# Patient Record
Sex: Male | Born: 1935
Health system: Southern US, Community
[De-identification: ages and names within clinical notes are randomized; demographics above are authoritative.]

## PROBLEM LIST (undated history)

## (undated) DIAGNOSIS — I639 Cerebral infarction, unspecified: Secondary | ICD-10-CM

## (undated) DIAGNOSIS — H409 Unspecified glaucoma: Secondary | ICD-10-CM

## (undated) DIAGNOSIS — E876 Hypokalemia: Secondary | ICD-10-CM

## (undated) DIAGNOSIS — H269 Unspecified cataract: Secondary | ICD-10-CM

## (undated) DIAGNOSIS — K5900 Constipation, unspecified: Secondary | ICD-10-CM

## (undated) DIAGNOSIS — I509 Heart failure, unspecified: Secondary | ICD-10-CM

## (undated) DIAGNOSIS — I10 Essential (primary) hypertension: Secondary | ICD-10-CM

## (undated) DIAGNOSIS — S37009A Unspecified injury of unspecified kidney, initial encounter: Secondary | ICD-10-CM

## (undated) HISTORY — DX: Unspecified cataract: H26.9

## (undated) HISTORY — DX: Cerebral infarction, unspecified: I63.9

## (undated) HISTORY — DX: Constipation, unspecified: K59.00

## (undated) HISTORY — DX: Hypokalemia: E87.6

## (undated) HISTORY — DX: Heart failure, unspecified: I50.9

## (undated) HISTORY — DX: Unspecified injury of unspecified kidney, initial encounter: S37.009A

## (undated) HISTORY — DX: Unspecified glaucoma: H40.9

---

## 2015-01-10 ENCOUNTER — Emergency Department (HOSPITAL_BASED_OUTPATIENT_CLINIC_OR_DEPARTMENT_OTHER)
Admission: EM | Admit: 2015-01-10 | Discharge: 2015-01-10 | Disposition: A | Discharge status: Home / Self Care | Payer: Medicare PPO | Attending: Emergency Medicine | Admitting: Emergency Medicine

## 2015-01-10 ENCOUNTER — Emergency Department (HOSPITAL_BASED_OUTPATIENT_CLINIC_OR_DEPARTMENT_OTHER): Payer: Medicare PPO

## 2015-01-10 ENCOUNTER — Encounter (HOSPITAL_BASED_OUTPATIENT_CLINIC_OR_DEPARTMENT_OTHER): Payer: Self-pay | Admitting: *Deleted

## 2015-01-10 DIAGNOSIS — Y9301 Activity, walking, marching and hiking: Secondary | ICD-10-CM | POA: Diagnosis not present

## 2015-01-10 DIAGNOSIS — W01198A Fall on same level from slipping, tripping and stumbling with subsequent striking against other object, initial encounter: Secondary | ICD-10-CM | POA: Diagnosis not present

## 2015-01-10 DIAGNOSIS — Y998 Other external cause status: Secondary | ICD-10-CM | POA: Diagnosis not present

## 2015-01-10 DIAGNOSIS — S199XXA Unspecified injury of neck, initial encounter: Secondary | ICD-10-CM | POA: Insufficient documentation

## 2015-01-10 DIAGNOSIS — I10 Essential (primary) hypertension: Secondary | ICD-10-CM | POA: Diagnosis not present

## 2015-01-10 DIAGNOSIS — W19XXXA Unspecified fall, initial encounter: Secondary | ICD-10-CM

## 2015-01-10 DIAGNOSIS — Y92002 Bathroom of unspecified non-institutional (private) residence single-family (private) house as the place of occurrence of the external cause: Secondary | ICD-10-CM | POA: Diagnosis not present

## 2015-01-10 DIAGNOSIS — N3000 Acute cystitis without hematuria: Secondary | ICD-10-CM

## 2015-01-10 DIAGNOSIS — S0990XA Unspecified injury of head, initial encounter: Secondary | ICD-10-CM | POA: Insufficient documentation

## 2015-01-10 DIAGNOSIS — M542 Cervicalgia: Secondary | ICD-10-CM

## 2015-01-10 HISTORY — DX: Essential (primary) hypertension: I10

## 2015-01-10 LAB — URINALYSIS, ROUTINE W REFLEX MICROSCOPIC
BILIRUBIN URINE: NEGATIVE
Glucose, UA: NEGATIVE mg/dL
Hgb urine dipstick: NEGATIVE
KETONES UR: NEGATIVE mg/dL
NITRITE: POSITIVE — AB
PROTEIN: NEGATIVE mg/dL
SPECIFIC GRAVITY, URINE: 1.025 (ref 1.005–1.030)
UROBILINOGEN UA: 0.2 mg/dL (ref 0.0–1.0)
pH: 6 (ref 5.0–8.0)

## 2015-01-10 LAB — URINE MICROSCOPIC-ADD ON

## 2015-01-10 LAB — CBC WITH DIFFERENTIAL/PLATELET
BASOS ABS: 0 10*3/uL (ref 0.0–0.1)
BASOS PCT: 0 %
Eosinophils Absolute: 0.1 10*3/uL (ref 0.0–0.7)
Eosinophils Relative: 4 %
HCT: 35.6 % — ABNORMAL LOW (ref 39.0–52.0)
HEMOGLOBIN: 11.5 g/dL — AB (ref 13.0–17.0)
LYMPHS ABS: 1.1 10*3/uL (ref 0.7–4.0)
Lymphocytes Relative: 28 %
MCH: 23.4 pg — ABNORMAL LOW (ref 26.0–34.0)
MCHC: 32.3 g/dL (ref 30.0–36.0)
MCV: 72.5 fL — ABNORMAL LOW (ref 78.0–100.0)
Monocytes Absolute: 0.6 10*3/uL (ref 0.1–1.0)
Monocytes Relative: 15 %
NEUTROS PCT: 54 %
Neutro Abs: 2.2 10*3/uL (ref 1.7–7.7)
Platelets: 190 10*3/uL (ref 150–400)
RBC: 4.91 MIL/uL (ref 4.22–5.81)
RDW: 16.1 % — ABNORMAL HIGH (ref 11.5–15.5)
WBC: 4 10*3/uL (ref 4.0–10.5)

## 2015-01-10 LAB — BASIC METABOLIC PANEL
ANION GAP: 5 (ref 5–15)
BUN: 15 mg/dL (ref 6–20)
CO2: 26 mmol/L (ref 22–32)
Calcium: 9.5 mg/dL (ref 8.9–10.3)
Chloride: 109 mmol/L (ref 101–111)
Creatinine, Ser: 0.94 mg/dL (ref 0.61–1.24)
Glucose, Bld: 105 mg/dL — ABNORMAL HIGH (ref 65–99)
POTASSIUM: 4 mmol/L (ref 3.5–5.1)
SODIUM: 140 mmol/L (ref 135–145)

## 2015-01-10 MED ORDER — ACETAMINOPHEN 325 MG PO TABS
650.0000 mg | ORAL_TABLET | Freq: Once | ORAL | Status: AC
  Administered 2015-01-10: 650 mg via ORAL
  Filled 2015-01-10: qty 2

## 2015-01-10 MED ORDER — ACETAMINOPHEN 325 MG PO TABS: 650.0000 mg | ORAL_TABLET | Freq: Once | ORAL | Status: DC

## 2015-01-10 MED ORDER — CEPHALEXIN 250 MG PO CAPS
500.0000 mg | ORAL_CAPSULE | Freq: Once | ORAL | Status: AC
  Administered 2015-01-10: 500 mg via ORAL
  Filled 2015-01-10: qty 2

## 2015-01-10 MED ORDER — CEPHALEXIN 500 MG PO CAPS: 500.0000 mg | ORAL_CAPSULE | Freq: Two times a day (BID) | ORAL | Status: DC

## 2015-01-10 NOTE — ED Notes (Signed)
HIGH FALL RISK INTERVENTIONS INITIATED

## 2015-01-10 NOTE — ED Notes (Addendum)
C/o post neck pain (C collar on when arrived to room)

## 2015-01-10 NOTE — ED Provider Notes (Signed)
CSN: [id number]     Arrival date & time 01/10/15  1414 History   First MD Initiated Contact with Patient 01/10/15 1449     Chief Complaint  Patient presents with  . Fall     (Consider location/radiation/quality/duration/timing/severity/associated sxs/prior Treatment) HPI Comments: Patient lives at home presents with complaint of fall. Patient states that he was walking in his bathroom at approximately 11 PM last night. He suddenly felt weak. He did not feel dizzy or lose consciousness. He fell backwards and struck his head on a wall and fell to the ground. He was able to get himself up in bed. He did not call for help. Patient complaining of neck pain and took ibuprofen last night. No vision change, nausea, vomiting, weakness/numbness/tingling in arms or legs. Patient is at his normal baseline mentation per family. No other medical complaints.  Patient also reports feeling generally weak. He is unable to tell me how long this has been ongoing. He denies any specific symptoms of illness including URI symptoms, cough, chest pain, shortness of breath, abdominal pain, vomiting, diarrhea, dysuria, or skin rash.  Patient is a 81 y.o. male presenting with fall. The history is provided by the patient and a relative.  Fall Associated symptoms include neck pain and weakness (gneralized). Pertinent negatives include no chest pain, fatigue, headaches, nausea, numbness or vomiting.    Past Medical History  Diagnosis Date  . Hypertension    History reviewed. No pertinent past surgical history. No family history on file. Social History  Substance Use Topics  . Smoking status: Never Smoker   . Smokeless tobacco: Never Used  . Alcohol Use: No    Review of Systems  Constitutional: Negative for fatigue.  HENT: Negative for tinnitus.   Eyes: Negative for photophobia, pain and visual disturbance.  Respiratory: Negative for shortness of breath.   Cardiovascular: Negative for chest pain.   Gastrointestinal: Negative for nausea and vomiting.  Musculoskeletal: Positive for neck pain. Negative for back pain and gait problem.  Skin: Negative for wound.  Neurological: Positive for weakness (gneralized). Negative for dizziness, light-headedness, numbness and headaches.  Psychiatric/Behavioral: Negative for confusion and decreased concentration.    Allergies  Review of patient's allergies indicates no known allergies.  Home Medications   Prior to Admission medications   Not on File   BP 179/85 mmHg  Pulse 73  Temp(Src) 98.4 F (36.9 C) (Oral)  Resp 18  Ht  (1.803 m)  Wt 200 lb (90.719 kg)  BMI 27.91 kg/m2  SpO2 96%   Physical Exam  Constitutional: He is oriented to person, place, and time. He appears well-developed and well-nourished.  HENT:  Head: Normocephalic and atraumatic. Head is without raccoon's eyes and without Battle's sign.  Right Ear: Tympanic membrane, external ear and ear canal normal. No hemotympanum.  Left Ear: Tympanic membrane, external ear and ear canal normal. No hemotympanum.  Nose: Nose normal. No nasal septal hematoma.  Mouth/Throat: Oropharynx is clear and moist.  Eyes: Conjunctivae, EOM and lids are normal. Pupils are equal, round, and reactive to light.  No visible hyphema  Neck: Normal range of motion. Neck supple.  Cardiovascular: Normal rate and regular rhythm.   Pulmonary/Chest: Effort normal and breath sounds normal. No respiratory distress. He has no wheezes. He has no rales.  Abdominal: Soft. There is no tenderness.  Musculoskeletal: Normal range of motion.       Cervical back: He exhibits tenderness and bony tenderness. He exhibits normal range of motion.  Thoracic back: He exhibits no tenderness and no bony tenderness.       Lumbar back: He exhibits no tenderness and no bony tenderness.  Neurological: He is alert and oriented to person, place, and time. He has normal strength and normal reflexes. No cranial nerve  deficit or sensory deficit. Coordination normal. GCS eye subscore is 4. GCS verbal subscore is 5. GCS motor subscore is 6.  Skin: Skin is warm and dry.  Psychiatric: He has a normal mood and affect.  Nursing note and vitals reviewed.   ED Course  Procedures (including critical care time) Labs Review Labs Reviewed  URINALYSIS, ROUTINE W REFLEX MICROSCOPIC (NOT AT [facility]) - Abnormal; Notable for the following:    APPearance CLOUDY (*)    Nitrite POSITIVE (*)    Leukocytes, UA TRACE (*)    All other components within normal limits  CBC WITH DIFFERENTIAL/PLATELET - Abnormal; Notable for the following:    Hemoglobin 11.5 (*)    HCT 35.6 (*)    MCV 72.5 (*)    MCH 23.4 (*)    RDW 16.1 (*)    All other components within normal limits  BASIC METABOLIC PANEL - Abnormal; Notable for the following:    Glucose, Bld 105 (*)    All other components within normal limits  URINE MICROSCOPIC-ADD ON - Abnormal; Notable for the following:    Bacteria, UA MANY (*)    Casts HYALINE CASTS (*)    All other components within normal limits  URINE CULTURE    Imaging Review Ct Head Wo Contrast  01/10/2015   CLINICAL DATA:  Patient status post fall. Posterior neck pain. No reported loss of consciousness.  EXAM: CT HEAD WITHOUT CONTRAST  CT CERVICAL SPINE WITHOUT CONTRAST  TECHNIQUE: Multidetector CT imaging of the head and cervical spine was performed following the standard protocol without intravenous contrast. Multiplanar CT image reconstructions of the cervical spine were also generated.  COMPARISON:  None.  FINDINGS: CT HEAD FINDINGS  Ventricles and sulci are prominent compatible with atrophy. Extensive periventricular and subcortical white matter hypodensity compatible with chronic small vessel ischemic changes. Encephalomalacia within the left occipital lobe. Paranasal sinuses are unremarkable. Mastoid air cells are unremarkable. The calvarium is intact. No evidence for intracranial hemorrhage or mass  effect.  CT CERVICAL SPINE FINDINGS  There is focal kyphosis of the cervical spine at the C5 level with fusion of the C4-5 and C5-6 levels. Multilevel facet degenerative changes. Craniocervical junction is intact. No evidence for acute cervical spine fracture. Degenerative changes the posterior spinous processes involving the T1 level.  IMPRESSION: Left occipital lobe encephalomalacia, favored represent subacute to chronic infarct.  No acute intracranial hemorrhage.  No acute cervical spine fracture.  Multilevel degenerative changes and associated fusion of the cervical spine.   Electronically Signed   By: Annia Belt M.D.   On: 01/10/2015 15:44   Ct Cervical Spine Wo Contrast  01/10/2015   CLINICAL DATA:  Patient status post fall. Posterior neck pain. No reported loss of consciousness.  EXAM: CT HEAD WITHOUT CONTRAST  CT CERVICAL SPINE WITHOUT CONTRAST  TECHNIQUE: Multidetector CT imaging of the head and cervical spine was performed following the standard protocol without intravenous contrast. Multiplanar CT image reconstructions of the cervical spine were also generated.  COMPARISON:  None.  FINDINGS: CT HEAD FINDINGS  Ventricles and sulci are prominent compatible with atrophy. Extensive periventricular and subcortical white matter hypodensity compatible with chronic small vessel ischemic changes. Encephalomalacia within the left occipital lobe. Paranasal  sinuses are unremarkable. Mastoid air cells are unremarkable. The calvarium is intact. No evidence for intracranial hemorrhage or mass effect.  CT CERVICAL SPINE FINDINGS  There is focal kyphosis of the cervical spine at the C5 level with fusion of the C4-5 and C5-6 levels. Multilevel facet degenerative changes. Craniocervical junction is intact. No evidence for acute cervical spine fracture. Degenerative changes the posterior spinous processes involving the T1 level.  IMPRESSION: Left occipital lobe encephalomalacia, favored represent subacute to chronic  infarct.  No acute intracranial hemorrhage.  No acute cervical spine fracture.  Multilevel degenerative changes and associated fusion of the cervical spine.   Electronically Signed   By: Annia Belt M.D.   On: 01/10/2015 15:44   I have personally reviewed and evaluated these images and lab results as part of my medical decision-making.   EKG Interpretation None       2:50 PM Patient seen and examined. Work-up initiated. Medications ordered. Will check labs given vague c/o generalized weakness.   Vital signs reviewed and are as follows: BP 179/85 mmHg  Pulse 73  Temp(Src) 98.4 F (36.9 C) (Oral)  Resp 18  Ht 5\' 11"  (1.803 m)  Wt 200 lb (90.719 kg)  BMI 27.91 kg/m2  SpO2 96%  5:26 PM Patient discussed with Dr. Rubin Payor.   UA with nitrite positive, many bacteria. Culture sent but will treat. C-collar removed by myself. Patient has some left-sided paraspinal tenderness. Discussed imaging results and blood test results with family.  Encouraged PCP follow-up in the next 2-3 days for recheck. Patient to take his blood pressure medications when arriving home.  MDM   Final diagnoses:  Neck pain  Fall, initial encounter  Acute cystitis without hematuria  Essential hypertension   Fall/neck pain: Head CT and cervical spine imaging negative. No neurological deficits. Previously noted stroke.  Weakness: Workup suggestive of UTI only. Otherwise, blood counts and electrolytes noncontributory. Will treat with Keflex. Patient appears well. No indication for admission at this time. Family to monitor patient.    Renne Crigler, PA-C 01/10/15 1729  Benjiman Core, MD 01/11/15 [id number]

## 2015-01-10 NOTE — ED Notes (Signed)
c-collar applied in triage

## 2015-01-10 NOTE — ED Notes (Addendum)
Pt states fell last PM in bathroom, no LOC, felt weak, hit back of head on wall, able to get up on own

## 2015-01-10 NOTE — Discharge Instructions (Signed)
Please read and follow all provided instructions.  Your diagnoses today include:  1. Neck pain   2. Fall, initial encounter   3. Acute cystitis without hematuria   4. Essential hypertension     Tests performed today include:  CT scan of your head and neck that did not show any serious injury.  Blood counts and electrolytes - normal  Urine test - suggests urinary tract infection  Vital signs. See below for your results today.   Medications prescribed:   Keflex (cephalexin) - antibiotic  You have been prescribed an antibiotic medicine: take the entire course of medicine even if you are feeling better. Stopping early can cause the antibiotic not to work.  Take any prescribed medications only as directed.  Home care instructions:  Follow any educational materials contained in this packet.  BE VERY CAREFUL not to take multiple medicines containing Tylenol (also called acetaminophen). Doing so can lead to an overdose which can damage your liver and cause liver failure and possibly death.   Follow-up instructions: Please follow-up with your primary care provider in the next 2 days for further evaluation of your symptoms.   Return instructions:  SEEK IMMEDIATE MEDICAL ATTENTION IF:  There is confusion or drowsiness (although children frequently become drowsy after injury).   You cannot awaken the injured person.   You have more than one episode of vomiting.   You notice dizziness or unsteadiness which is getting worse, or inability to walk.   You have convulsions or unconsciousness.   You experience severe, persistent headaches not relieved by Tylenol.  You cannot use arms or legs normally.   There are changes in pupil sizes. (This is the black center in the colored part of the eye)   There is clear or bloody discharge from the nose or ears.   You have change in speech, vision, swallowing, or understanding.   Localized weakness, numbness, tingling, or change in bowel  or bladder control.  You have any other emergent concerns.  Additional Information: You have had a head injury which does not appear to require admission at this time.  Your vital signs today were: BP 201/93 mmHg   Pulse 73   Temp(Src) 98.4 F (36.9 C) (Oral)   Resp 18   Ht 5\' 11"  (1.803 m)   Wt 200 lb (90.719 kg)   BMI 27.91 kg/m2   SpO2 98% If your blood pressure (BP) was elevated above 135/85 this visit, please have this repeated by your doctor within one month. --------------

## 2015-01-10 NOTE — ED Notes (Signed)
MD at bedside. 

## 2015-01-10 NOTE — ED Notes (Signed)
Patient transported to CT, attempted to do hourly rounding.

## 2015-01-10 NOTE — ED Notes (Signed)
Pt reports he fell in shower last night.C/o neck pain. Reports he hit head but denies LOC

## 2015-01-10 NOTE — ED Notes (Signed)
Patient transported to X-ray, via stretcher, sr x 2 up 

## 2015-01-12 LAB — URINE CULTURE

## 2015-01-15 ENCOUNTER — Encounter (HOSPITAL_COMMUNITY): Payer: Self-pay | Admitting: Emergency Medicine

## 2015-01-15 ENCOUNTER — Inpatient Hospital Stay (HOSPITAL_COMMUNITY)
Admission: EM | Admit: 2015-01-15 | Discharge: 2015-01-20 | DRG: 557 | Disposition: A | Discharge status: Skilled Nursing Facility | Payer: Medicare PPO | Attending: Internal Medicine | Admitting: Internal Medicine

## 2015-01-15 DIAGNOSIS — H409 Unspecified glaucoma: Secondary | ICD-10-CM | POA: Diagnosis present

## 2015-01-15 DIAGNOSIS — K5900 Constipation, unspecified: Secondary | ICD-10-CM | POA: Diagnosis present

## 2015-01-15 DIAGNOSIS — E8770 Fluid overload, unspecified: Secondary | ICD-10-CM | POA: Diagnosis present

## 2015-01-15 DIAGNOSIS — M6282 Rhabdomyolysis: Principal | ICD-10-CM | POA: Diagnosis present

## 2015-01-15 DIAGNOSIS — I10 Essential (primary) hypertension: Secondary | ICD-10-CM | POA: Diagnosis present

## 2015-01-15 DIAGNOSIS — B9689 Other specified bacterial agents as the cause of diseases classified elsewhere: Secondary | ICD-10-CM | POA: Diagnosis present

## 2015-01-15 DIAGNOSIS — R296 Repeated falls: Secondary | ICD-10-CM

## 2015-01-15 DIAGNOSIS — R140 Abdominal distension (gaseous): Secondary | ICD-10-CM

## 2015-01-15 DIAGNOSIS — J810 Acute pulmonary edema: Secondary | ICD-10-CM | POA: Diagnosis not present

## 2015-01-15 DIAGNOSIS — W19XXXA Unspecified fall, initial encounter: Secondary | ICD-10-CM

## 2015-01-15 DIAGNOSIS — Z515 Encounter for palliative care: Secondary | ICD-10-CM | POA: Diagnosis present

## 2015-01-15 DIAGNOSIS — D509 Iron deficiency anemia, unspecified: Secondary | ICD-10-CM | POA: Diagnosis present

## 2015-01-15 DIAGNOSIS — N390 Urinary tract infection, site not specified: Secondary | ICD-10-CM | POA: Diagnosis present

## 2015-01-15 DIAGNOSIS — Z8673 Personal history of transient ischemic attack (TIA), and cerebral infarction without residual deficits: Secondary | ICD-10-CM

## 2015-01-15 DIAGNOSIS — Z87891 Personal history of nicotine dependence: Secondary | ICD-10-CM

## 2015-01-15 DIAGNOSIS — R062 Wheezing: Secondary | ICD-10-CM

## 2015-01-15 DIAGNOSIS — Z6827 Body mass index (BMI) 27.0-27.9, adult: Secondary | ICD-10-CM

## 2015-01-15 DIAGNOSIS — E46 Unspecified protein-calorie malnutrition: Secondary | ICD-10-CM | POA: Diagnosis present

## 2015-01-15 DIAGNOSIS — R7989 Other specified abnormal findings of blood chemistry: Secondary | ICD-10-CM | POA: Diagnosis present

## 2015-01-15 DIAGNOSIS — Y92009 Unspecified place in unspecified non-institutional (private) residence as the place of occurrence of the external cause: Secondary | ICD-10-CM

## 2015-01-15 DIAGNOSIS — E860 Dehydration: Secondary | ICD-10-CM | POA: Diagnosis present

## 2015-01-15 DIAGNOSIS — E876 Hypokalemia: Secondary | ICD-10-CM | POA: Diagnosis present

## 2015-01-15 DIAGNOSIS — R0602 Shortness of breath: Secondary | ICD-10-CM

## 2015-01-15 DIAGNOSIS — J9801 Acute bronchospasm: Secondary | ICD-10-CM | POA: Diagnosis present

## 2015-01-15 DIAGNOSIS — M542 Cervicalgia: Secondary | ICD-10-CM | POA: Diagnosis present

## 2015-01-15 DIAGNOSIS — R531 Weakness: Secondary | ICD-10-CM | POA: Diagnosis not present

## 2015-01-15 HISTORY — DX: Cerebral infarction, unspecified: I63.9

## 2015-01-15 NOTE — ED Notes (Signed)
Bed: WA15 Expected date:  Expected time:  Means of arrival:  Comments: EMS  

## 2015-01-15 NOTE — ED Notes (Signed)
Pt arrived to the ED via EMS with a complaint of a fall.  Pt states he was in the bathroom when his legs gave out and he fell to the floor.  Pt family contacted him at 1200 hrs and talked with patient.  Pt was attempted to be contacted at 1700 without response.  Pt states he can't remember when he fell.  Pt states he was unable to get up off the floor.  Pt was found at 2345 hrs by family in the bathroom on the floor.  Pt has abrasions on the right upper leg and knee area.  Pt is unable to lift right leg.

## 2015-01-16 ENCOUNTER — Emergency Department (HOSPITAL_COMMUNITY): Payer: Medicare PPO

## 2015-01-16 ENCOUNTER — Encounter (HOSPITAL_COMMUNITY): Payer: Self-pay | Admitting: Family Medicine

## 2015-01-16 DIAGNOSIS — R7989 Other specified abnormal findings of blood chemistry: Secondary | ICD-10-CM | POA: Diagnosis present

## 2015-01-16 DIAGNOSIS — J9801 Acute bronchospasm: Secondary | ICD-10-CM | POA: Diagnosis present

## 2015-01-16 DIAGNOSIS — E860 Dehydration: Secondary | ICD-10-CM | POA: Diagnosis present

## 2015-01-16 DIAGNOSIS — J810 Acute pulmonary edema: Secondary | ICD-10-CM | POA: Diagnosis not present

## 2015-01-16 DIAGNOSIS — R29898 Other symptoms and signs involving the musculoskeletal system: Secondary | ICD-10-CM | POA: Diagnosis not present

## 2015-01-16 DIAGNOSIS — M6282 Rhabdomyolysis: Principal | ICD-10-CM

## 2015-01-16 DIAGNOSIS — E872 Acidosis: Secondary | ICD-10-CM | POA: Diagnosis not present

## 2015-01-16 DIAGNOSIS — I10 Essential (primary) hypertension: Secondary | ICD-10-CM | POA: Diagnosis present

## 2015-01-16 DIAGNOSIS — Z515 Encounter for palliative care: Secondary | ICD-10-CM | POA: Diagnosis not present

## 2015-01-16 DIAGNOSIS — N390 Urinary tract infection, site not specified: Secondary | ICD-10-CM | POA: Diagnosis present

## 2015-01-16 DIAGNOSIS — K5900 Constipation, unspecified: Secondary | ICD-10-CM | POA: Diagnosis not present

## 2015-01-16 DIAGNOSIS — Z8673 Personal history of transient ischemic attack (TIA), and cerebral infarction without residual deficits: Secondary | ICD-10-CM

## 2015-01-16 DIAGNOSIS — R531 Weakness: Secondary | ICD-10-CM | POA: Diagnosis present

## 2015-01-16 DIAGNOSIS — R296 Repeated falls: Secondary | ICD-10-CM | POA: Diagnosis present

## 2015-01-16 DIAGNOSIS — Z6827 Body mass index (BMI) 27.0-27.9, adult: Secondary | ICD-10-CM | POA: Diagnosis not present

## 2015-01-16 DIAGNOSIS — E46 Unspecified protein-calorie malnutrition: Secondary | ICD-10-CM | POA: Diagnosis present

## 2015-01-16 DIAGNOSIS — T796XXD Traumatic ischemia of muscle, subsequent encounter: Secondary | ICD-10-CM | POA: Diagnosis not present

## 2015-01-16 DIAGNOSIS — Z87891 Personal history of nicotine dependence: Secondary | ICD-10-CM | POA: Diagnosis not present

## 2015-01-16 DIAGNOSIS — E876 Hypokalemia: Secondary | ICD-10-CM | POA: Diagnosis present

## 2015-01-16 DIAGNOSIS — H409 Unspecified glaucoma: Secondary | ICD-10-CM | POA: Diagnosis present

## 2015-01-16 DIAGNOSIS — D509 Iron deficiency anemia, unspecified: Secondary | ICD-10-CM | POA: Diagnosis present

## 2015-01-16 DIAGNOSIS — E8770 Fluid overload, unspecified: Secondary | ICD-10-CM | POA: Diagnosis present

## 2015-01-16 DIAGNOSIS — B9689 Other specified bacterial agents as the cause of diseases classified elsewhere: Secondary | ICD-10-CM | POA: Diagnosis present

## 2015-01-16 LAB — BASIC METABOLIC PANEL
ANION GAP: 10 (ref 5–15)
BUN: 21 mg/dL — ABNORMAL HIGH (ref 6–20)
CALCIUM: 9.3 mg/dL (ref 8.9–10.3)
CHLORIDE: 108 mmol/L (ref 101–111)
CO2: 27 mmol/L (ref 22–32)
Creatinine, Ser: 0.97 mg/dL (ref 0.61–1.24)
GFR calc non Af Amer: >60 mL/min (ref >60)
Glucose, Bld: 122 mg/dL — ABNORMAL HIGH (ref 65–99)
Potassium: 3.2 mmol/L — ABNORMAL LOW (ref 3.5–5.1)
SODIUM: 145 mmol/L (ref 135–145)

## 2015-01-16 LAB — URINALYSIS, ROUTINE W REFLEX MICROSCOPIC
Bilirubin Urine: NEGATIVE
GLUCOSE, UA: NEGATIVE mg/dL
KETONES UR: 40 mg/dL — AB
LEUKOCYTES UA: NEGATIVE
Nitrite: NEGATIVE
PH: 5.5 (ref 5.0–8.0)
Protein, ur: 100 mg/dL — AB
Specific Gravity, Urine: 1.024 (ref 1.005–1.030)
Urobilinogen, UA: 1 mg/dL (ref 0.0–1.0)

## 2015-01-16 LAB — COMPREHENSIVE METABOLIC PANEL
ALBUMIN: 3.6 g/dL (ref 3.5–5.0)
ALT: 29 U/L (ref 17–63)
ANION GAP: 9 (ref 5–15)
AST: 74 U/L — ABNORMAL HIGH (ref 15–41)
Alkaline Phosphatase: 42 U/L (ref 38–126)
BILIRUBIN TOTAL: 0.5 mg/dL (ref 0.3–1.2)
BUN: 23 mg/dL — ABNORMAL HIGH (ref 6–20)
CO2: 26 mmol/L (ref 22–32)
Calcium: 9.5 mg/dL (ref 8.9–10.3)
Chloride: 104 mmol/L (ref 101–111)
Creatinine, Ser: 0.95 mg/dL (ref 0.61–1.24)
Glucose, Bld: 140 mg/dL — ABNORMAL HIGH (ref 65–99)
POTASSIUM: 3.1 mmol/L — AB (ref 3.5–5.1)
Sodium: 139 mmol/L (ref 135–145)
TOTAL PROTEIN: 8.7 g/dL — AB (ref 6.5–8.1)

## 2015-01-16 LAB — CK
CK TOTAL: 3334 U/L — AB (ref 49–397)
CK TOTAL: 5085 U/L — AB (ref 49–397)
CK TOTAL: 5316 U/L — AB (ref 49–397)

## 2015-01-16 LAB — CBC WITH DIFFERENTIAL/PLATELET
BASOS PCT: 0 %
Basophils Absolute: 0 10*3/uL (ref 0.0–0.1)
Eosinophils Absolute: 0 10*3/uL (ref 0.0–0.7)
Eosinophils Relative: 0 %
HEMATOCRIT: 34.3 % — AB (ref 39.0–52.0)
Hemoglobin: 11.3 g/dL — ABNORMAL LOW (ref 13.0–17.0)
Lymphocytes Relative: 9 %
Lymphs Abs: 0.7 10*3/uL (ref 0.7–4.0)
MCH: 24.2 pg — AB (ref 26.0–34.0)
MCHC: 32.9 g/dL (ref 30.0–36.0)
MCV: 73.6 fL — AB (ref 78.0–100.0)
MONO ABS: 0.6 10*3/uL (ref 0.1–1.0)
MONOS PCT: 8 %
NEUTROS ABS: 6.5 10*3/uL (ref 1.7–7.7)
Neutrophils Relative %: 83 %
Platelets: 187 10*3/uL (ref 150–400)
RBC: 4.66 MIL/uL (ref 4.22–5.81)
RDW: 15.4 % (ref 11.5–15.5)
WBC: 7.8 10*3/uL (ref 4.0–10.5)

## 2015-01-16 LAB — CBC
HEMATOCRIT: 32.8 % — AB (ref 39.0–52.0)
HEMOGLOBIN: 10.8 g/dL — AB (ref 13.0–17.0)
MCH: 24.1 pg — ABNORMAL LOW (ref 26.0–34.0)
MCHC: 32.9 g/dL (ref 30.0–36.0)
MCV: 73.2 fL — ABNORMAL LOW (ref 78.0–100.0)
Platelets: 198 10*3/uL (ref 150–400)
RBC: 4.48 MIL/uL (ref 4.22–5.81)
RDW: 15.4 % (ref 11.5–15.5)
WBC: 5.7 10*3/uL (ref 4.0–10.5)

## 2015-01-16 LAB — IRON AND TIBC
Iron: 15 ug/dL — ABNORMAL LOW (ref 45–182)
SATURATION RATIOS: 9 % — AB (ref 17.9–39.5)
TIBC: 167 ug/dL — AB (ref 250–450)
UIBC: 152 ug/dL

## 2015-01-16 LAB — FERRITIN: FERRITIN: 196 ng/mL (ref 24–336)

## 2015-01-16 LAB — URINE MICROSCOPIC-ADD ON

## 2015-01-16 LAB — I-STAT CG4 LACTIC ACID, ED: Lactic Acid, Venous: 2.41 mmol/L (ref 0.5–2.0)

## 2015-01-16 LAB — LACTIC ACID, PLASMA: LACTIC ACID, VENOUS: 1.3 mmol/L (ref 0.5–2.0)

## 2015-01-16 MED ORDER — POTASSIUM CHLORIDE CRYS ER 20 MEQ PO TBCR
40.0000 meq | EXTENDED_RELEASE_TABLET | Freq: Once | ORAL | Status: AC
  Administered 2015-01-16: 40 meq via ORAL
  Filled 2015-01-16: qty 2

## 2015-01-16 MED ORDER — SODIUM CHLORIDE 0.9 % IV SOLN
INTRAVENOUS | Status: DC
  Administered 2015-01-16: 01:00:00 via INTRAVENOUS

## 2015-01-16 MED ORDER — DOCUSATE SODIUM 100 MG PO CAPS
100.0000 mg | ORAL_CAPSULE | Freq: Two times a day (BID) | ORAL | Status: DC
  Administered 2015-01-16 – 2015-01-20 (×9): 100 mg via ORAL
  Filled 2015-01-16: qty 1

## 2015-01-16 MED ORDER — CEPHALEXIN 500 MG PO CAPS
500.0000 mg | ORAL_CAPSULE | Freq: Two times a day (BID) | ORAL | Status: DC
  Administered 2015-01-16: 500 mg via ORAL
  Filled 2015-01-16 (×2): qty 1

## 2015-01-16 MED ORDER — BRIMONIDINE TARTRATE-TIMOLOL 0.2-0.5 % OP SOLN: 1.0000 [drp] | Freq: Two times a day (BID) | OPHTHALMIC | Status: DC

## 2015-01-16 MED ORDER — DORZOLAMIDE HCL 2 % OP SOLN
1.0000 [drp] | Freq: Three times a day (TID) | OPHTHALMIC | Status: DC
  Administered 2015-01-16 – 2015-01-20 (×12): 1 [drp] via OPHTHALMIC
  Filled 2015-01-16: qty 10

## 2015-01-16 MED ORDER — SODIUM CHLORIDE 0.9 % IV BOLUS (SEPSIS)
500.0000 mL | Freq: Once | INTRAVENOUS | Status: AC
  Administered 2015-01-16: 500 mL via INTRAVENOUS

## 2015-01-16 MED ORDER — AMLODIPINE BESYLATE 5 MG PO TABS
5.0000 mg | ORAL_TABLET | Freq: Every day | ORAL | Status: DC
  Administered 2015-01-16: 5 mg via ORAL
  Filled 2015-01-16 (×2): qty 1

## 2015-01-16 MED ORDER — HYDROCODONE-ACETAMINOPHEN 5-325 MG PO TABS
1.0000 | ORAL_TABLET | Freq: Once | ORAL | Status: AC
  Administered 2015-01-16: 1 via ORAL
  Filled 2015-01-16: qty 1

## 2015-01-16 MED ORDER — ENOXAPARIN SODIUM 40 MG/0.4ML ~~LOC~~ SOLN
40.0000 mg | SUBCUTANEOUS | Status: DC
  Administered 2015-01-16 – 2015-01-20 (×5): 40 mg via SUBCUTANEOUS
  Filled 2015-01-16 (×5): qty 0.4

## 2015-01-16 MED ORDER — TIMOLOL MALEATE 0.5 % OP SOLN
1.0000 [drp] | Freq: Two times a day (BID) | OPHTHALMIC | Status: DC
  Administered 2015-01-16 – 2015-01-20 (×9): 1 [drp] via OPHTHALMIC
  Filled 2015-01-16: qty 5

## 2015-01-16 MED ORDER — OXYCODONE HCL 5 MG PO TABS
5.0000 mg | ORAL_TABLET | ORAL | Status: DC | PRN
  Administered 2015-01-16 – 2015-01-17 (×3): 5 mg via ORAL
  Filled 2015-01-16 (×3): qty 1

## 2015-01-16 MED ORDER — HYDRALAZINE HCL 20 MG/ML IJ SOLN
10.0000 mg | Freq: Four times a day (QID) | INTRAMUSCULAR | Status: DC | PRN
  Administered 2015-01-17: 10 mg via INTRAVENOUS
  Filled 2015-01-16: qty 1

## 2015-01-16 MED ORDER — SODIUM CHLORIDE 0.9 % IV SOLN
INTRAVENOUS | Status: DC
  Administered 2015-01-16 (×3): via INTRAVENOUS
  Administered 2015-01-16: 500 mL via INTRAVENOUS
  Administered 2015-01-17 – 2015-01-18 (×3): via INTRAVENOUS

## 2015-01-16 MED ORDER — BRIMONIDINE TARTRATE 0.2 % OP SOLN
1.0000 [drp] | Freq: Two times a day (BID) | OPHTHALMIC | Status: DC
  Administered 2015-01-16 – 2015-01-20 (×9): 1 [drp] via OPHTHALMIC
  Filled 2015-01-16: qty 5

## 2015-01-16 NOTE — H&P (Signed)
History and Physical  Jack Zuniga  OIT:[id number]  DOB: [birth year]  DOA: 01/15/2015  Referring physician: Lorre Nick, MD PCP: Devra Dopp, MD   Chief Complaint: Fall, found down after 8 hours  HPI: Jack Zuniga is a 81 y.o. male with a past medical history significant for CVA in 2011 no residual deficits, and hypertension who presents after a fall with elevated CK level and lactic acidosis.  One week ago the patient was seen in the ED at [facility] after a fall because "my legs gave out," had a normal head and C-spine CT, was noted to have a UTI, and was discharged with Keflex. He followed up with his PCP 2 days ago and was referred for physical therapy.  Today around noon the patient was going to the bathroom after having just woken up, when his legs again felt too weak, and he slowly fell to the ground and couldn't get up. He estimates that he laid on the ground all day because he couldn't get up until his daughters called him around bedtime, couldn't reach him, and found him still lying on the floor.  In the ED, the patient was noted to have hypokalemia, mild anemia, elevated lactic acid, and a CK of 3334 U/L. he was started on IV fluids and admitted to [facility] for rhabdomyolysis and dehydration.   Review of Systems:  Patient seen 3:29 AM on 01/16/2015. Pt complains of leg weakness, right anterior thigh pain, fatigue, residual left neck pain from his fall 1 week ago. Pt denies any palpitations, dyspnea, fever, urinary symptoms.  Twelve systems were reviewed and were negative except as just noted or noted in the history of present illness.  Past Medical History  Diagnosis Date  . Hypertension   . Stroke [facility])     2011 in [place], no deficits  The above past medical history was reviewed.  History reviewed. No pertinent past surgical history. no major surgeries. The above surgical history was reviewed.  Social History: Patient lives alone. He is from [place] originally. 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 He smoked cigars years ago. He does not actively smoke. He is independent with all ADLs, but his daughters assist with all IADLs. He walks with a cane.  No Known Allergies  Family History  Problem Relation Age of Onset  . Stroke Neg Hx   . Kidney disease Neg Hx     Prior to Admission medications   Medication Sig Start Date End Date Taking? Authorizing Provider  acetaminophen (TYLENOL) 325 MG tablet Take 650 mg by mouth every 6 (six) hours as needed (for pain.).   Yes Historical Provider, MD  amLODipine (NORVASC) 5 MG tablet Take 5 mg by mouth daily. 09/01/14  Yes Historical Provider, MD  cephALEXin (KEFLEX) 500 MG capsule Take 1 capsule (500 mg total) by mouth 2 (two) times daily. 01/10/15  Yes Joshua Geiple, PA-C  COMBIGAN 0.2-0.5 % ophthalmic solution Place 1 drop into both eyes 2 (two) times daily. 12/24/14  Yes Historical Provider, MD  dorzolamide (TRUSOPT) 2 % ophthalmic solution Place 1 drop into both eyes 3 (three) times daily. 12/08/13  Yes Historical Provider, MD  Multiple Vitamins-Minerals (ONE-A-DAY MENS 50+ ADVANTAGE) TABS Take 1 tablet by mouth daily.   Yes Historical Provider, MD    Physical Exam: BP 179/96 mmHg  Pulse 106  Temp(Src) 100.2 F (37.9 C) (Rectal)  Resp 25  SpO2 98% General: Elderly male, alert and in no obvious distress.  Responds appropriately to questions.  HEENT: Head normal.  Corneas clear, conjunctivae and sclerae normal without injection or icterus, lids and lashes normal.  PERRL and EOMI.  Nose normal.  Mucus membranes dry.   No airway deformities.  Neck supple.  Skin: Warm and dry.  No jaundice.  No suspicious rashes or lesions.  On the right knee there are a few small scratches. Cardiac: RRR, nl S1-S2, no murmurs appreciated.  Capillary refill is less than 2 seconds.   No LE edema.  Radial and DP pulses 2+ and symmetric. Respiratory: Normal respiratory rate and rhythm.  CTAB without rales or wheezes. Abdomen: BS present.  Abdomen soft. No TTP or rebound all quadrants.   Neuro: Sensorium intact.  Cranial nerves 3-12 intact.  Speech is fluent.  Naming is grossly intact, and the patient's recall, recent and remote, as well as general fund of knowledge seem within normal limits for age.  4/5 grip strength and 5/5 elbow flexion, 4/5 elbow extension, symmetrically.  3/5 hip flexion, bilaterally, normal ankle plantar flexions.  Weak dorsiflexion.  All muscle strength symmetric.     Psych: Slow but appropriate affect.  Normal rate and rhythm of speech.  Thought content appropriate, and thought process linear.  No evidence of aural or visual hallucinations or delusions. Attention and concentration are normal.           Labs on Admission:  The metabolic panel is notable for hypokalemia, normal sodium, normal serum creatinine. Mildly elevated AST, normal ALT, normal bilirubin. Lactic acid 2.41 mmol/L. The complete blood count is notable for microcytic anemia.     Radiological Exams on Admission: Personally reviewed: Dg Chest 2 View 01/16/2015    IMPRESSION: No focal lung opacity.    EKG: Independently reviewed. Sinus tachycardia without ST changes.  Assessment/Plan Principal Problem:   Rhabdomyolysis Active Problems:   Dehydration   Elevated lactic acid level   Essential hypertension   History of CVA (cerebrovascular accident)    1. Rhabdomyolysis:  After being found down all day. This is very mild. -Normal saline at 150 mL per hour -Repeat CK  2. Elevated lactic acid:  In setting of being found down all day. No evidence of heart failure or sepsis. -Fluid resuscitation -Repeat lactic acid  3. Hypertension:  Hypertensive at admission. -Continue amlodipine 5 mg daily -Titrate as needed  4. Glaucoma: Stable. -Continue dorzolamide, brimonidine, and team all  5. Weakness and neck and leg pain: After fall. The neck pain is related to his fall 1 week ago. The leg pain started after the fall today.  The weakness is bilateral and located in proximal muscles, which makes me think that it is related to his elevated CK. If it persists after the resolution of his elevated CK, consultation with neurology, or neurologic imaging would be warranted. -PT eval, and possible rehabilitation placement -Acetaminophen as needed -Oxycodone for moderate to severe pain -Reevaluate muscle weakness after resolution of CK  6. Microcytic anemia: Chronicity unknown. -Iron studies     DVT PPx: Lovenox Diet: Regular Code Status: Full Family Communication: The patient's diagnosis, treatment plan, and CODE STATUS were discussed with his 2 daughters granddaughter at the bedside. All questions were answered.   Disposition Plan:  The appropriate admission status for this patient is INPATIENT. Inpatient status is judged to be reasonable and necessary in order to provide the required intensity of service to ensure the patient's safety. The patient's presenting symptoms, physical exam findings, and initial radiographic and laboratory data in the context of their chronic comorbidities is  felt to place them at high risk for further clinical deterioration. Furthermore, it is not anticipated that the patient will be medically stable for discharge from the hospital within 2 midnights of admission. The following factors support the admission status of inpatient.   A. The patient's presenting symptoms include pain, weakness. B. The worrisome physical exam findings include proximal leg weakness, inability to walk.   C. The initial radiographic and laboratory data are worrisome because of elevated lactic acid and elevated CK. D. The chronic co-morbidities include hypertension and history of stroke. E. Patient requires inpatient status due to high intensity of service, high risk for further deterioration and high frequency of surveillance required. F. I certify that at the point of admission it is my clinical judgment that the  patient will require inpatient hospital care spanning beyond 2 midnights from the point of admission.     Alberteen Sam [facility] Pager [phone]

## 2015-01-16 NOTE — ED Provider Notes (Signed)
CSN: [id number]     Arrival date & time 01/15/15  2331 History  By signing my name below, I, Phillis Haggis, attest that this documentation has been prepared under the direction and in the presence of Lorre Nick, MD. Electronically Signed: Phillis Haggis, ED Scribe. 01/16/2015. 2:35 AM.  Chief Complaint  Patient presents with  . Fall   The history is provided by the patient and a relative. No language interpreter was used.  HPI Comments: Jack Zuniga is a 81 y.o. Male with hx of HTN brought in by EMS who presents to the Emergency Department complaining of a fall onset earlier today. Daughter states that they called him at his home at 2 PM today. She states that they called him again at 5 PM to no response. They went to his home and found the pt on the floor in the bathroom. Pt states that he was in the bathroom the entire time since he fell. Pt states that he was going to the bathroom when he rolled off of the toilet and fell to the floor; states that he was too weak to get up. Pt reports that he is ambulatory with a walker, but did not have it in his possession when he fell; daughter states that his cane was in the bathroom with him. Reports abrasions to the right upper leg and knee. Reports associated pain with movement in the right leg and hip. He states that he was seen last Saturday for a fall and was diagnosed with a UTI. He states that his neck pain is from the prior fall. Denies fever, vomiting, chest pain, back pain, cough, SOB, knee pain, LOC, hitting head, or new neck pain. Pt states he lives alone. Denies hx of falls before last Saturday. PCP: Devra Dopp, MD  Past Medical History  Diagnosis Date  . Hypertension    History reviewed. No pertinent past surgical history. History reviewed. No pertinent family history. Social History  Substance Use Topics  . Smoking status: Never Smoker   . Smokeless tobacco: Never Used  . Alcohol Use: No    Review of Systems  Constitutional:  Negative for fever.  Respiratory: Negative for cough.   Gastrointestinal: Negative for vomiting.  Genitourinary: Negative for dysuria.  Skin: Positive for wound.  Neurological: Positive for weakness. Negative for syncope and headaches.  All other systems reviewed and are negative.  Allergies  Review of patient's allergies indicates no known allergies.  Home Medications   Prior to Admission medications   Medication Sig Start Date End Date Taking? Authorizing Provider  cephALEXin (KEFLEX) 500 MG capsule Take 1 capsule (500 mg total) by mouth 2 (two) times daily. 01/10/15   Renne Crigler, PA-C   BP 165/74 mmHg  Pulse 114  Temp(Src) 99.6 F (37.6 C) (Oral)  Resp 18  SpO2 95%  Physical Exam  Constitutional: He is oriented to person, place, and time. He appears well-developed and well-nourished.  Non-toxic appearance. No distress.  HENT:  Head: Normocephalic and atraumatic.  Eyes: Conjunctivae, EOM and lids are normal. Pupils are equal, round, and reactive to light.  Neck: Normal range of motion. Neck supple. No tracheal deviation present. No thyroid mass present.  Paraspinal cervical tenderness, with no midline cervical tenderness  Cardiovascular: Normal rate, regular rhythm and normal heart sounds.  Exam reveals no gallop.   No murmur heard. Pulmonary/Chest: Effort normal and breath sounds normal. No stridor. No respiratory distress. He has no decreased breath sounds. He has no wheezes. He has no rhonchi.  He has no rales.  Abdominal: Soft. Normal appearance and bowel sounds are normal. He exhibits no distension. There is no tenderness. There is no rebound and no CVA tenderness.  Musculoskeletal: Normal range of motion. He exhibits no edema or tenderness.  Contusion to dorsal surface of right foot; multiple abrasions to right elbow; superficial abrasion to anterior surface of right thigh. No shortening or rotation of RLE but does have pain with ROM.   Neurological: He is alert and  oriented to person, place, and time. He has normal strength. No cranial nerve deficit or sensory deficit. GCS eye subscore is 4. GCS verbal subscore is 5. GCS motor subscore is 6.  Skin: Skin is warm and dry. No abrasion and no rash noted.  Psychiatric: He has a normal mood and affect. His speech is normal and behavior is normal.  Nursing note and vitals reviewed.   ED Course  Procedures (including critical care time) DIAGNOSTIC STUDIES: Oxygen Saturation is 95% on RA, adequate by my interpretation.    COORDINATION OF CARE: 12:14 AM-Discussed treatment plan which includes EKG and labs with pt at bedside and pt agreed to plan.   Labs Review Labs Reviewed  CBC WITH DIFFERENTIAL/PLATELET - Abnormal; Notable for the following:    Hemoglobin 11.3 (*)    HCT 34.3 (*)    MCV 73.6 (*)    MCH 24.2 (*)    All other components within normal limits  COMPREHENSIVE METABOLIC PANEL - Abnormal; Notable for the following:    Potassium 3.1 (*)    Glucose, Bld 140 (*)    BUN 23 (*)    Total Protein 8.7 (*)    AST 74 (*)    All other components within normal limits  CK - Abnormal; Notable for the following:    Total CK 3334 (*)    All other components within normal limits  I-STAT CG4 LACTIC ACID, ED - Abnormal; Notable for the following:    Lactic Acid, Venous 2.41 (*)    All other components within normal limits  URINE CULTURE  CULTURE, BLOOD (ROUTINE X 2)  CULTURE, BLOOD (ROUTINE X 2)  URINALYSIS, ROUTINE W REFLEX MICROSCOPIC (NOT AT [facility])   Imaging Review Dg Chest 2 View  01/16/2015   CLINICAL DATA:  Shortness of breath, hypertension. Fall, found down on floor by family member.  EXAM: CHEST  2 VIEW  COMPARISON:  None.  FINDINGS: Cardiac silhouette is mildly enlarged, mediastinal silhouette is nonsuspicious. Mildly elevated RIGHT hemidiaphragm. Chronic appearing interstitial changes without pleural effusion or focal consolidation. Probable calcified RIGHT hilar lymph nodes. Small nodular  densities seen on the lateral RIGHT radiographs suggest granulomas or pulmonary vessel en face. LEFT apical pleural thickening. No pneumothorax. High-riding RIGHT humeral head compatible with old rotator cuff injury. Soft tissue planes are nonsuspicious.  IMPRESSION: Borderline cardiomegaly.  No acute pulmonary process.  Mild chronic appearing interstitial changes. Probable granulomatous disease.   Electronically Signed   By: Awilda Metro M.D.   On: 01/16/2015 02:09      EKG Interpretation   Date/Time:  Friday January 15 2015 23:42:04 EDT Ventricular Rate:  115 PR Interval:  173 QRS Duration: 96 QT Interval:  356 QTC Calculation: 492 R Axis:   16 Text Interpretation:  Sinus tachycardia Biatrial enlargement LVH with  secondary repolarization abnormality Borderline prolonged QT interval  Confirmed by Freida Busman  MD, Melea Prezioso ([id number]) on 01/16/2015 12:35:12 AM Also  confirmed by Freida Busman  MD, Skylyn Slezak ([id number])  on 01/16/2015 12:40:14 AM  MDM   Final diagnoses:  None    I personally performed the services described in this documentation, which was scribed in my presence. The recorded information has been reviewed and is accurate.  Patient evidence of dehydration on his urinalysis. Lactate is elevated. Low-grade temperature noted. Does have large hemoglobin in his urine. CK elevation noted and likely from when he was on the floor. Will IV hydrate here and admitted to medicine for observation. Potassium replenished  Lorre Nick, MD 01/16/15 0300

## 2015-01-16 NOTE — ED Notes (Signed)
MD Freida Busman notified about elevated lactic acid

## 2015-01-16 NOTE — ED Notes (Signed)
Pt cannot use restroom at this time, aware specimen is needed. 

## 2015-01-16 NOTE — Progress Notes (Signed)
[facility] PROGRESS NOTE  Jack Zuniga WPV:[id number] DOB: 08-08-1919 DOA: 01/15/2015 PCP: Devra Dopp, MD  Brief narrative 81 year old male living independently with history of CVA in 2011 and hypertension presented to the ED after sustaining a fall at home and was lying on the floor for over 8 hours. He was found to have lactic acidosis was markedly elevated CPK on presentation. One week back he was [place] in [place] after sustaining a fall at home with normal imaging and found to have UTI and discharged on Keflex. He saw his PCP 2 days back and was referred for physical therapy. Patient on the day of admission was going to the bathroom after waking up when his legs felt very weak and he slowly fell onto the ground but could not get up. He reports that he was on the ground floor all day as he was unable to get up and called his daughter. His daughter came to the house in the evening and found him on the floor. In the ED patient was found to be hypokalemic with elevated lactic acid and CK >3000. He was admitted to hospice service for rhabdomyolysis and dehydration.  Assessment/Plan: Acute rhabdomyolysis Secondary to fall and being on the floor all day. CPK were sent to 5000 this morning. Electrolytes normal except for low potassium. UA and LFTs normal. Denies being on statin or taking any over-the-counter or herbal supplements. No recent viral illness. Monitor with IV hydration. Follow CPK closely. Monitor urine output closely.  Elevated lactic acid Secondary to dehydration. Repeat lactic acid resolved after IV hydration.  Weakness and frequent falls Patient complained of neck pain and right hip pain secondary to recent fall. Imaging were normal. He has bilateral proximal muscle weakness which is likely associated with elevated CPK. Ordered PT evaluation. Pain control with Tylenol.   Essential hypertension Blood pressure elevated on admission. Continue  amlodipine.  History of CVA Not on any medications.  Hypokalemia Replenish potassium.  Diet: Regular DVT prophylaxis: Subcutaneous Lovenox  Code Status: Full code Family Communication: None at bedside Disposition Plan: Continue inpatient monitoring. May need skilled nursing facility.   Consultants:  None  Procedures:  None  Antibiotics:  None  HPI/Subjective: Seen and examined. Reports occasional pain to his right thigh. No overnight issues.  Objective: Filed Vitals:   01/16/15 0600  BP: 165/85  Pulse: 102  Temp: 99.1 F (37.3 C)  Resp: 18    Intake/Output Summary (Last 24 hours) at 01/16/15 1016 Last data filed at 01/16/15 0830  Gross per 24 hour  Intake  237.5 ml  Output      1 ml  Net  236.5 ml   There were no vitals filed for this visit.  Exam:   General:  Elderly male in no acute distress  HEENT: No pallor, moist oral mucosa, supple neck  Chest: Clear to auscultation bilaterally, no added sounds  CVS: Normal S1 and S2, no murmur or murmurs rub or gallop  GI: Soft, nondistended, nontender, bowel sounds present  Musculoskeletal: Warm, some weakness noted in proximal leg muscles, nontender  CNS: Alert and oriented   Data Reviewed: Basic Metabolic Panel:  Recent Labs Lab 01/10/15 1502 01/16/15 0053 01/16/15 0534  NA 140 139 145  K 4.0 3.1* 3.2*  CL 109 104 108  CO2 26 26 27   GLUCOSE 105* 140* 122*  BUN 15 23* 21*  CREATININE 0.94 0.95 0.97  CALCIUM 9.5 9.5 9.3   Liver Function Tests:  Recent  Labs Lab 01/16/15 0053  AST 74*  ALT 29  ALKPHOS 42  BILITOT 0.5  PROT 8.7*  ALBUMIN 3.6   No results for input(s): LIPASE, AMYLASE in the last 168 hours. No results for input(s): AMMONIA in the last 168 hours. CBC:  Recent Labs Lab 01/10/15 1502 01/16/15 0053 01/16/15 0534  WBC 4.0 7.8 5.7  NEUTROABS 2.2 6.5  --   HGB 11.5* 11.3* 10.8*  HCT 35.6* 34.3* 32.8*  MCV 72.5* 73.6* 73.2*  PLT 190 187 198   Cardiac  Enzymes:  Recent Labs Lab 01/16/15 0100 01/16/15 0534  CKTOTAL 3334* 5085*   BNP (last 3 results) No results for input(s): BNP in the last 8760 hours.  ProBNP (last 3 results) No results for input(s): PROBNP in the last 8760 hours.  CBG: No results for input(s): GLUCAP in the last 168 hours.  Recent Results (from the past 240 hour(s))  Urine culture     Status: None   Collection Time: 01/10/15  3:02 PM  Result Value Ref Range Status   Specimen Description URINE, CLEAN CATCH  Final   Special Requests NONE  Final   Culture   Final    MULTIPLE SPECIES PRESENT, SUGGEST RECOLLECTION Performed at [facility]    Report Status 01/12/2015 FINAL  Final  Culture, blood (routine x 2)     Status: None (Preliminary result)   Collection Time: 01/16/15 12:51 AM  Result Value Ref Range Status   Specimen Description BLOOD LEFT HAND  Final   Special Requests BOTTLES DRAWN AEROBIC AND ANAEROBIC 5CC  Final   Culture PENDING  Incomplete   Report Status PENDING  Incomplete  Culture, blood (routine x 2)     Status: None (Preliminary result)   Collection Time: 01/16/15  1:30 AM  Result Value Ref Range Status   Specimen Description BLOOD BLOOD LEFT FOREARM  Final   Special Requests BOTTLES DRAWN AEROBIC AND ANAEROBIC 5CC  Final   Culture PENDING  Incomplete   Report Status PENDING  Incomplete     Studies: Dg Chest 2 View  01/16/2015   CLINICAL DATA:  Shortness of breath, hypertension. Fall, found down on floor by family member.  EXAM: CHEST  2 VIEW  COMPARISON:  None.  FINDINGS: Cardiac silhouette is mildly enlarged, mediastinal silhouette is nonsuspicious. Mildly elevated RIGHT hemidiaphragm. Chronic appearing interstitial changes without pleural effusion or focal consolidation. Probable calcified RIGHT hilar lymph nodes. Small nodular densities seen on the lateral RIGHT radiographs suggest granulomas or pulmonary vessel en face. LEFT apical pleural thickening. No pneumothorax.  High-riding RIGHT humeral head compatible with old rotator cuff injury. Soft tissue planes are nonsuspicious.  IMPRESSION: Borderline cardiomegaly.  No acute pulmonary process.  Mild chronic appearing interstitial changes. Probable granulomatous disease.   Electronically Signed   By: Awilda Metro M.D.   On: 01/16/2015 02:09    Scheduled Meds: . amLODipine  5 mg Oral Daily  . brimonidine  1 drop Both Eyes BID   And  . timolol  1 drop Both Eyes BID  . cephALEXin  500 mg Oral BID  . docusate sodium  100 mg Oral BID  . dorzolamide  1 drop Both Eyes TID  . enoxaparin (LOVENOX) injection  40 mg Subcutaneous Q24H   Continuous Infusions: . sodium chloride 150 mL/hr at 01/16/15 0816      Time spent: 25 minutes    Jack Zuniga  [facility] Pager [phone]. If 7PM-7AM, please contact night-coverage at [email or web address], password [facility] 01/16/2015, 10:16 AM  LOS: 0 days

## 2015-01-17 ENCOUNTER — Inpatient Hospital Stay (HOSPITAL_COMMUNITY): Payer: Medicare PPO

## 2015-01-17 DIAGNOSIS — E46 Unspecified protein-calorie malnutrition: Secondary | ICD-10-CM | POA: Diagnosis present

## 2015-01-17 DIAGNOSIS — K5900 Constipation, unspecified: Secondary | ICD-10-CM | POA: Diagnosis present

## 2015-01-17 DIAGNOSIS — R29898 Other symptoms and signs involving the musculoskeletal system: Secondary | ICD-10-CM

## 2015-01-17 DIAGNOSIS — I10 Essential (primary) hypertension: Secondary | ICD-10-CM | POA: Diagnosis present

## 2015-01-17 LAB — COMPREHENSIVE METABOLIC PANEL
ALBUMIN: 2.7 g/dL — AB (ref 3.5–5.0)
ALT: 42 U/L (ref 17–63)
AST: 118 U/L — AB (ref 15–41)
Alkaline Phosphatase: 35 U/L — ABNORMAL LOW (ref 38–126)
Anion gap: 6 (ref 5–15)
BUN: 21 mg/dL — AB (ref 6–20)
CHLORIDE: 113 mmol/L — AB (ref 101–111)
CO2: 25 mmol/L (ref 22–32)
Calcium: 8.8 mg/dL — ABNORMAL LOW (ref 8.9–10.3)
Creatinine, Ser: 1.02 mg/dL (ref 0.61–1.24)
GFR calc Af Amer: >60 mL/min (ref >60)
GLUCOSE: 95 mg/dL (ref 65–99)
POTASSIUM: 3.6 mmol/L (ref 3.5–5.1)
Sodium: 144 mmol/L (ref 135–145)
Total Bilirubin: 0.7 mg/dL (ref 0.3–1.2)
Total Protein: 7.1 g/dL (ref 6.5–8.1)

## 2015-01-17 LAB — CK: CK TOTAL: 3375 U/L — AB (ref 49–397)

## 2015-01-17 MED ORDER — IOHEXOL 300 MG/ML  SOLN
50.0000 mL | Freq: Once | INTRAMUSCULAR | Status: AC | PRN
  Administered 2015-01-17: 50 mL via ORAL

## 2015-01-17 MED ORDER — HYDRALAZINE HCL 20 MG/ML IJ SOLN
20.0000 mg | Freq: Four times a day (QID) | INTRAMUSCULAR | Status: DC | PRN
  Administered 2015-01-18: 20 mg via INTRAVENOUS
  Filled 2015-01-17: qty 1

## 2015-01-17 MED ORDER — IOHEXOL 300 MG/ML  SOLN
100.0000 mL | Freq: Once | INTRAMUSCULAR | Status: AC | PRN
  Administered 2015-01-17: 100 mL via INTRAVENOUS

## 2015-01-17 MED ORDER — LABETALOL HCL 5 MG/ML IV SOLN
5.0000 mg | Freq: Once | INTRAVENOUS | Status: AC
  Administered 2015-01-17: 5 mg via INTRAVENOUS
  Filled 2015-01-17: qty 4

## 2015-01-17 MED ORDER — POLYETHYLENE GLYCOL 3350 17 G PO PACK
17.0000 g | PACK | Freq: Every day | ORAL | Status: DC
  Administered 2015-01-17 – 2015-01-20 (×4): 17 g via ORAL

## 2015-01-17 MED ORDER — AMLODIPINE BESYLATE 10 MG PO TABS
10.0000 mg | ORAL_TABLET | Freq: Every day | ORAL | Status: DC
  Administered 2015-01-17 – 2015-01-20 (×4): 10 mg via ORAL
  Filled 2015-01-17 (×4): qty 1

## 2015-01-17 NOTE — Progress Notes (Addendum)
[facility] PROGRESS NOTE  Edu Cresto RTM:[id number] DOB: 03-08-1920 DOA: 01/15/2015 PCP: Devra Dopp, MD  Brief narrative 81 year old male living independently with history of CVA in 2011 and hypertension presented to the ED after sustaining a fall at home and was lying on the floor for over 8 hours. He was found to have lactic acidosis was markedly elevated CPK on presentation. One week back he was [place] in [place] after sustaining a fall at home with normal imaging and found to have UTI and discharged on Keflex. He saw his PCP 2 days back and was referred for physical therapy. Patient on the day of admission was going to the bathroom after waking up when his legs felt very weak and he slowly fell onto the ground but could not get up. He reports that he was on the ground floor all day as he was unable to get up and called his daughter. His daughter came to the house in the evening and found him on the floor. In the ED patient was found to be hypokalemic with elevated lactic acid and CK >3000. He was admitted to hospice service for rhabdomyolysis and dehydration.  Assessment/Plan: Acute rhabdomyolysis Secondary to fall and being on the floor all day. CPK elevated up to 5300, now slowly trending down. Has low potassium and elevated AST. UA shows granular past and possible myoglobinuria.. Denies being on statin or taking any over-the-counter or herbal supplements. No recent viral illness. Continue to monitor with IV hydration. Follow CPK and urine output closely.   Elevated lactic acid Secondary to dehydration. Repeat lactic acid resolved after IV hydration.  Weakness and frequent falls Patient complained of neck pain and right hip pain secondary to recent fall. Imaging were normal. He has bilateral proximal muscle weakness which is likely associated with elevated CPK. PT evaluation pending. Pain control with when necessary Tylenol.   Essential  hypertension Elevated blood pressure. Will increase dose of amlodipine.  History of CVA Not on any medications.  Hypokalemia Replenished  Constipation and abdominal distension Continue colace , add miralax. Check abdominal x ray.    Protein calorie malnutrition with Poor po intake Nutritionist consulted.  Diet: Regular DVT prophylaxis: Subcutaneous Lovenox  Code Status: Full code Family Communication: None at bedside. D/w daughter carolyn on the phone Disposition Plan: Continue inpatient monitoring. Lives alone and would benefit from SNF. Social worker consulted    Consultants:  None  Procedures:  None  Antibiotics:  None  HPI/Subjective: Seen and examined. Reports occasional pain to his right thigh. No overnight issues.  Objective: Filed Vitals:   01/17/15 0601  BP: 185/93  Pulse: 98  Temp: 98.7 F (37.1 C)  Resp: 16    Intake/Output Summary (Last 24 hours) at 01/17/15 0935 Last data filed at 01/17/15 0141  Gross per 24 hour  Intake   3780 ml  Output    350 ml  Net   3430 ml   Filed Weights   01/16/15 1100  Weight: 90.719 kg (200 lb)    Exam:   General:  Elderly male in no acute distress  HEENT: No pallor, moist oral mucosa, supple neck  Chest: Clear to auscultation bilaterally, no added sounds  CVS: Normal S1 and S2, no murmur or murmurs rub or gallop  GI: Soft, nondistended, nontender, bowel sounds present  Musculoskeletal: Warm, some weakness noted in proximal leg muscles, nontender  CNS: Alert and oriented   Data Reviewed: Basic Metabolic Panel:  Recent Labs Lab  01/10/15 1502 01/16/15 0053 01/16/15 0534 01/17/15 0718  NA 140 139 145 144  K 4.0 3.1* 3.2* 3.6  CL 109 104 108 113*  CO2 GLUCOSE 105* 140* 122* 95  BUN 15 23* 21* 21*  CREATININE 0.94 0.95 0.97 1.02  CALCIUM 9.5 9.5 9.3 8.8*   Liver Function Tests:  Recent Labs Lab 01/16/15 0053 01/17/15 0718  AST 74* 118*  ALT 29 42  ALKPHOS 42 35*   BILITOT 0.5 0.7  PROT 8.7* 7.1  ALBUMIN 3.6 2.7*   No results for input(s): LIPASE, AMYLASE in the last 168 hours. No results for input(s): AMMONIA in the last 168 hours. CBC:  Recent Labs Lab 01/10/15 1502 01/16/15 0053 01/16/15 0534  WBC 4.0 7.8 5.7  NEUTROABS 2.2 6.5  --   HGB 11.5* 11.3* 10.8*  HCT 35.6* 34.3* 32.8*  MCV 72.5* 73.6* 73.2*  PLT 190 187 198   Cardiac Enzymes:  Recent Labs Lab 01/16/15 0100 01/16/15 0534 01/16/15 1921 01/17/15 0718  CKTOTAL 3334* 5085* 5316* 3375*   BNP (last 3 results) No results for input(s): BNP in the last 8760 hours.  ProBNP (last 3 results) No results for input(s): PROBNP in the last 8760 hours.  CBG: No results for input(s): GLUCAP in the last 168 hours.  Recent Results (from the past 240 hour(s))  Urine culture     Status: None   Collection Time: 01/10/15  3:02 PM  Result Value Ref Range Status   Specimen Description URINE, CLEAN CATCH  Final   Special Requests NONE  Final   Culture   Final    MULTIPLE SPECIES PRESENT, SUGGEST RECOLLECTION Performed at [facility]    Report Status 01/12/2015 FINAL  Final  Culture, blood (routine x 2)     Status: None (Preliminary result)   Collection Time: 01/16/15 12:51 AM  Result Value Ref Range Status   Specimen Description BLOOD LEFT HAND  Final   Special Requests BOTTLES DRAWN AEROBIC AND ANAEROBIC 5CC  Final   Culture PENDING  Incomplete   Report Status PENDING  Incomplete  Culture, blood (routine x 2)     Status: None (Preliminary result)   Collection Time: 01/16/15  1:30 AM  Result Value Ref Range Status   Specimen Description BLOOD BLOOD LEFT FOREARM  Final   Special Requests BOTTLES DRAWN AEROBIC AND ANAEROBIC 5CC  Final   Culture PENDING  Incomplete   Report Status PENDING  Incomplete     Studies: Dg Chest 2 View  01/16/2015   CLINICAL DATA:  Shortness of breath, hypertension. Fall, found down on floor by family member.  EXAM: CHEST  2 VIEW  COMPARISON:   None.  FINDINGS: Cardiac silhouette is mildly enlarged, mediastinal silhouette is nonsuspicious. Mildly elevated RIGHT hemidiaphragm. Chronic appearing interstitial changes without pleural effusion or focal consolidation. Probable calcified RIGHT hilar lymph nodes. Small nodular densities seen on the lateral RIGHT radiographs suggest granulomas or pulmonary vessel en face. LEFT apical pleural thickening. No pneumothorax. High-riding RIGHT humeral head compatible with old rotator cuff injury. Soft tissue planes are nonsuspicious.  IMPRESSION: Borderline cardiomegaly.  No acute pulmonary process.  Mild chronic appearing interstitial changes. Probable granulomatous disease.   Electronically Signed   By: Awilda Metro M.D.   On: 01/16/2015 02:09    Scheduled Meds: . amLODipine  5 mg Oral Daily  . brimonidine  1 drop Both Eyes BID   And  . timolol  1 drop Both Eyes BID  .  docusate sodium  100 mg Oral BID  . dorzolamide  1 drop Both Eyes TID  . enoxaparin (LOVENOX) injection  40 mg Subcutaneous Q24H   Continuous Infusions: . sodium chloride 150 mL/hr at 01/17/15 0434      Time spent: 25 minutes    Cathaleen Korol  [facility] Pager [phone]. If 7PM-7AM, please contact night-coverage at [email or web address], password [facility] 01/17/2015, 9:35 AM  LOS: 1 day

## 2015-01-17 NOTE — Clinical Social Work Note (Signed)
Clinical Social Work Assessment  Patient Details  Name: Jack Zuniga MRN: [id number] Date of Birth: [birth year]  Date of referral:  01/17/15               Reason for consult:  Discharge Planning, Facility Placement                Permission sought to share information with:  Facility Art therapist granted to share information::  Yes, Verbal Permission Granted  Name::        Agency::     Relationship::     Contact Information:     Housing/Transportation Living arrangements for the past 2 months:  Apartment Source of Information:  Patient, Adult Children Patient Interpreter Needed:  None Criminal Activity/Legal Involvement Pertinent to Current Situation/Hospitalization:    Significant Relationships:  Adult Children Lives with:  Self Do you feel safe going back to the place where you live?  No (ST Rehab needed.) Need for family participation in patient care:  Yes (Comment)  Care giving concerns:  [name] care cannot be managed at home following hospital d/c.   Social Worker assessment / plan:  Pt hospitalized on 01/15/15 with acute rhabdomyolysis and weakness with frequent falls. CSW met with pt / daughter's to assist with d/c planning. PT has recommended ST Rehab at d/c. Pt / family are in agreement with this plan. SNF search has been initiated and bed offers pending. Pt has [employer] which requires prior authorization. CSW will assist with authorization process as needed.  Employment status:  Retired Nurse, adult PT Recommendations:  [place] / Referral to community resources:  [place]  Patient/Family's Response to care:  Pt / family feel ST Rehab is needed.  Patient/Family's Understanding of and Emotional Response to Diagnosis, Current Treatment, and Prognosis: Pt / family are aware of pt's medical status. [name] feels strongly about pt going for " rehab " and not long term  placement. Pt / family reassured that pt is being referred for ST Rehab.   Emotional Assessment Appearance:  Appears stated age Attitude/Demeanor/Rapport:  Other (cooperative) Affect (typically observed):  Calm, Accepting Orientation:  Oriented to Self, Oriented to Place, Oriented to  Time, Oriented to Situation Alcohol / Substance use:  Not Applicable Psych involvement (Current and /or in the community):  No (Comment)  Discharge Needs  Concerns to be addressed:  Discharge Planning Concerns Readmission within the last 30 days:  No Current discharge risk:  None Barriers to Discharge:  No Barriers Identified   Luretha Rued, [place] 01/17/2015, 2:32 PM

## 2015-01-17 NOTE — Plan of Care (Signed)
Problem: Phase I Progression Outcomes Goal: Hemodynamically stable Outcome: Not Met (add Reason) HTN, tachy this afternoon

## 2015-01-17 NOTE — Progress Notes (Signed)
PT Cancellation Note  Patient Details Name: Tadeh Facemire MRN: [id number] DOB: 1919-07-13   Cancelled Treatment:     PT order received but eval deferred - pt for CT of pelvis.  Will follow.   Maury Groninger 01/17/2015, 3:02 PM

## 2015-01-17 NOTE — Progress Notes (Signed)
Pt had elevated bp 188/110, pulse 116. Administered 10 mg Hydralazine. Had NT recheck bp about 20 min later. It was higher than before at 196/110, pulse 133. Notifiied Provider on call. Obtained EKG. Pt in sinus tach, pulse 133. Obtained new orders to reduce bp and pulse.

## 2015-01-17 NOTE — Clinical Social Work Placement (Signed)
   CLINICAL SOCIAL WORK PLACEMENT  NOTE  Date:  01/17/2015  Patient Details  Name: Jack Zuniga MRN: [id number] Date of Birth: Apr 04, 1920  Clinical Social Work is seeking post-discharge placement for this patient at the Skilled  Nursing Facility level of care (*CSW will initial, date and re-position this form in  chart as items are completed):  Yes   Patient/family provided with [place] Clinical Social Work Department's list of facilities offering this level of care within the geographic area requested by the patient (or if unable, by the patient's family).  Yes   Patient/family informed of their freedom to choose among providers that offer the needed level of care, that participate in Medicare, Medicaid or managed care program needed by the patient, have an available bed and are willing to accept the patient.  Yes   Patient/family informed of [facility]'s ownership interest in [facility] and [facility], as well as of the fact that they are under no obligation to receive care at these facilities.  PASRR submitted to EDS on 01/17/15     PASRR number received on 01/17/15     Existing PASRR number confirmed on       FL2 transmitted to all facilities in geographic area requested by pt/family on 01/17/15     FL2 transmitted to all facilities within larger geographic area on       Patient informed that his/her managed care company has contracts with or will negotiate with certain facilities, including the following:            Patient/family informed of bed offers received.  Patient chooses bed at       Physician recommends and patient chooses bed at      Patient to be transferred to   on  .  Patient to be transferred to facility by       Patient family notified on   of transfer.  Name of family member notified:        PHYSICIAN       Additional Comment:    _______________________________________________ Royetta Asal, LCSW  [phone] 01/17/2015, 2:44  PM

## 2015-01-18 LAB — COMPREHENSIVE METABOLIC PANEL
ALT: 46 U/L (ref 17–63)
AST: 116 U/L — ABNORMAL HIGH (ref 15–41)
Albumin: 2.6 g/dL — ABNORMAL LOW (ref 3.5–5.0)
Alkaline Phosphatase: 38 U/L (ref 38–126)
Anion gap: 11 (ref 5–15)
BUN: 23 mg/dL — ABNORMAL HIGH (ref 6–20)
CALCIUM: 8.7 mg/dL — AB (ref 8.9–10.3)
CHLORIDE: 114 mmol/L — AB (ref 101–111)
CO2: 19 mmol/L — ABNORMAL LOW (ref 22–32)
CREATININE: 0.9 mg/dL (ref 0.61–1.24)
Glucose, Bld: 121 mg/dL — ABNORMAL HIGH (ref 65–99)
Potassium: 3.6 mmol/L (ref 3.5–5.1)
Sodium: 144 mmol/L (ref 135–145)
Total Bilirubin: 0.7 mg/dL (ref 0.3–1.2)
Total Protein: 6.9 g/dL (ref 6.5–8.1)

## 2015-01-18 LAB — URINE CULTURE: Culture: 60000

## 2015-01-18 LAB — CK: CK TOTAL: 2599 U/L — AB (ref 49–397)

## 2015-01-18 MED ORDER — ENSURE ENLIVE PO LIQD
237.0000 mL | Freq: Every morning | ORAL | Status: DC
  Administered 2015-01-18 – 2015-01-19 (×2): 237 mL via ORAL

## 2015-01-18 NOTE — Care Management Note (Signed)
Case Management Note  Patient Details  Name: Jack Zuniga MRN: [id number] Date of Birth: 27-Jun-1919  Subjective/Objective:        Admitted s/p fall, rhabdo             Action/Plan: Discharge planning per CSW  Expected Discharge Date:                  Expected Discharge Plan:  Skilled Nursing Facility  In-House Referral:  Clinical Social Work  Discharge planning Services  CM Consult  Post Acute Care Choice:  NA Choice offered to:  NA  DME Arranged:  N/A DME Agency:  NA  HH Arranged:  NA HH Agency:  NA  Status of Service:  Completed, signed off  Medicare Important Message Given:    Date Medicare IM Given:    Medicare IM give by:    Date Additional Medicare IM Given:    Additional Medicare Important Message give by:     If discussed at Long Length of Stay Meetings, dates discussed:    Additional Comments:  Alexis Goodell, RN 01/18/2015, 9:03 AM

## 2015-01-18 NOTE — Progress Notes (Signed)
I attempted to insert flexiseal tube, but pt was tightening up his rectum. I asked him to try to relax and take slow deep breaths in hopes it would be easier to insert. Pt stated that he was trying to, but was unable to relax his rectum adequately enough to insert the tube. I tried several times to no avail. I notified provider on call, and was told to hold off inserting tube for now.  Will continue to monitor.

## 2015-01-18 NOTE — Evaluation (Signed)
Physical Therapy Evaluation Patient Details Name: Jack Zuniga MRN: [id number] DOB: 01-25-1920 Today's Date: 01/18/2015   History of Present Illness  81 yo male admitted with rhabdomyolysis. Hx of CVA. Pt is from home alone.   Clinical Impression  On eval, pt required Mod assist for bed mobility and Min assist to stand/ambulate ~15' x 2 with RW. Pt tolerated activity fairly well. Recommend ST rehab at SNF to maximize independence and safety with functional mobility.     Follow Up Recommendations SNF    Equipment Recommendations  Rolling walker with 5" wheels    Recommendations for Other Services       Precautions / Restrictions Precautions Precautions: Fall Restrictions Weight Bearing Restrictions: No      Mobility  Bed Mobility Overal bed mobility: Needs Assistance Bed Mobility: Supine to Sit     Supine to sit: Mod assist;HOB elevated     General bed mobility comments: Assist for trunk and LEs. Increased time. Utilized bedpad to aid with scooting.   Transfers Overall transfer level: Needs assistance Equipment used: Rolling walker (2 wheeled) Transfers: Sit to/from Stand Sit to Stand: Min assist         General transfer comment: Assist to rise, stabilize, control descent. VCs safety, technique, hand placement  Ambulation/Gait Ambulation/Gait assistance: Min assist Ambulation Distance (Feet): 15 Feet (x2) Assistive device: Rolling walker (2 wheeled) Gait Pattern/deviations: Step-through pattern;Decreased stride length;Trunk flexed     General Gait Details: assist to stabilize and maneuver safely with rw. slow gait speed. vcs safety, posture, distance from [employer]            Wheelchair Mobility    Modified Rankin (Stroke Patients Only)       Balance Overall balance assessment: Needs assistance         Standing balance support: Bilateral upper extremity supported;During functional activity Standing balance-Leahy Scale: Poor                                Pertinent Vitals/Pain Pain Assessment: Faces Faces Pain Scale: Hurts little more Pain Location: neck Pain Descriptors / Indicators: Sore;Aching Pain Intervention(s): Monitored during session;Repositioned    Home Living Family/patient expects to be discharged to:: Private residence Living Arrangements: Alone   Type of Home: Apartment Home Access: Level entry       Home Equipment: Cane - single point      Prior Function Level of Independence: Independent with assistive device(s)         Comments: used cane     Hand Dominance        Extremity/Trunk Assessment   Upper Extremity Assessment: Generalized weakness           Lower Extremity Assessment: Generalized weakness      Cervical / Trunk Assessment: Kyphotic  Communication   Communication: No difficulties  Cognition Arousal/Alertness: Awake/alert Behavior During Therapy: WFL for tasks assessed/performed Overall Cognitive Status: Within Functional Limits for tasks assessed                      General Comments      Exercises        Assessment/Plan    PT Assessment Patient needs continued PT services  PT Diagnosis Difficulty walking;Generalized weakness;Acute pain   PT Problem List Decreased strength;Decreased activity tolerance;Decreased balance;Decreased mobility;Decreased knowledge of use of DME;Pain  PT Treatment Interventions DME instruction;Gait training;Functional mobility training;Therapeutic activities;Patient/family education;Balance training;Therapeutic exercise   PT Goals (  Current goals can be found in the Care Plan section) Acute Rehab PT Goals Patient Stated Goal: none stated PT Goal Formulation: With patient Time For Goal Achievement: 02/01/15 Potential to Achieve Goals: Good    Frequency Min 3X/week   Barriers to discharge        Co-evaluation               End of Session   Activity Tolerance: Patient tolerated treatment  well Patient left: in chair;with call bell/phone within reach;with chair alarm set           Time: 1152-1212 PT Time Calculation (min) (ACUTE ONLY): 20 min   Charges:   PT Evaluation $Initial PT Evaluation Tier I: 1 Procedure     PT G Codes:        Rebeca Alert, MPT Pager: [phone]

## 2015-01-18 NOTE — Progress Notes (Signed)
As I was starting in administer soap suds enema, I noticed that pt had a large watery stool in the bed. There was no solid material. As ordered, I attempted to give the enema. After I inserted the tube into rectum, there was so many air bubbles coming from rectum that it was backing up into the tube and bucket. The bucket actually had liquid stool from the rectum in it, and the it was bubbly over with air.  Since this was  unsuccessful, the order stated for me to insert a rectal tube. I will contact portable and obtain a rectal tube.

## 2015-01-18 NOTE — Progress Notes (Signed)
Initial Nutrition Assessment  DOCUMENTATION CODES:   Not applicable  INTERVENTION:  - Will order Ensure Enlive once/day, this supplement provides 350 kcal and 20 grams of protein - Please help pt with meal set up; he states it is difficult for him due to poor vision - RD will continue to monitor for needs  NUTRITION DIAGNOSIS:   Inadequate oral intake related to other (see comment) (needs meal set up, food cold upon arrival to room) as evidenced by per patient/family report.  GOAL:   Patient will meet greater than or equal to 90% of their needs  MONITOR:   PO intake, Supplement acceptance, Weight trends, Labs, I & O's  REASON FOR ASSESSMENT:   Consult Assessment of nutrition requirement/status  ASSESSMENT:   81 y.o. male with a past medical history significant for CVA in 2011 no residual deficits, and hypertension who presents after a fall with elevated CK level and lactic acidosis.  Pt seen for consult. BMI indicates overweight status. Pt reports that he had a good appetite PTA and that he was living at a nursing home and his daughters would come in to help him often. He states that since admission no one has been helping him with meal tray set up and that it is difficult for him to do it on his own due to poor vision. He states that he can feed himself but does need help with opening items and cutting items.   Pt denies chewing or swallowing issues. Per chart review, he ate 0% breakfast, 40% lunch, and 50% dinner yesterday. He states that foods have been cold when he has received them which makes it no longer palatable for him.   No muscle or fat wasting noted and pt denies any recent weight changes. Only weight in chart other than current weight is weight from 01/10/15 which is consistent with current weight.  Not meeting needs since admission. Medications reviewed. Labs reviewed; Cl: 114 mmol/L, BUN elevated, Ca: 8.7 mg/dL.   Diet Order:  Diet regular Room service  appropriate?: Yes; Fluid consistency:: Thin  Skin:  Reviewed, no issues  Last BM:  9/28  Height:   Ht Readings from Last 1 Encounters:  01/16/15 5\' 11"  (1.803 m)    Weight:   Wt Readings from Last 1 Encounters:  01/16/15 200 lb (90.719 kg)    Ideal Body Weight:  78.18 kg (kg)  BMI:  Body mass index is 27.91 kg/(m^2).  Estimated Nutritional Needs:   Kcal:  1450-1650  Protein:  65-75 grams  Fluid:  2-2.2 L/day  EDUCATION NEEDS:   No education needs identified at this time     Trenton Gammon, RD, LDN Inpatient Clinical Dietitian Pager # [phone] After hours/weekend pager # [phone]

## 2015-01-18 NOTE — Progress Notes (Signed)
[facility] PROGRESS NOTE  Jack Zuniga ZOX:[id number] DOB: 03/14/1920 DOA: 01/15/2015 PCP: Devra Dopp, MD  Brief narrative 81 year old male living independently with history of CVA in 2011 and hypertension presented to the ED after sustaining a fall at home and was lying on the floor for over 8 hours. He was found to have lactic acidosis was markedly elevated CPK on presentation. One week back he was seen in the ED [facility] [place] after sustaining a fall at home with normal imaging and found to have UTI and discharged on Keflex. He saw his PCP 2 days back and was referred for physical therapy. Patient on the day of admission was going to the bathroom after waking up when his legs felt very weak and he slowly fell onto the ground but could not get up. He reports that he was on the ground floor all day as he was unable to get up and called his daughter. His daughter came to the house in the evening and found him on the floor. In the ED patient was found to be hypokalemic with elevated lactic acid and CK >3000. He was admitted for rhabdomyolysis and dehydration.  Assessment/Plan: Acute rhabdomyolysis Secondary to fall and being on the floor all day. CPK elevated up to 5300, now slowly trending down. Has low potassium and elevated AST. UA shows granular past and possible myoglobinuria.. Denies being on statin or taking any over-the-counter or herbal supplements. No recent viral illness. Continue to monitor with IV hydration. Ck 3375---2599  Constipation and abdominal distension, CT colonic ileus.  Continue colace , add miralax.  CT showed colonic ileus.  Has had Bowel movement, tolerating diet, denies abdominal pain.   Elevated lactic acid Secondary to dehydration. Repeat lactic acid resolved after IV hydration.  Weakness and frequent falls Patient complained of neck pain and right hip pain secondary to recent fall. Imaging were normal. He has bilateral proximal muscle  weakness which is likely associated with elevated CPK.  Pain control with when necessary Tylenol.   Essential hypertension Elevated blood pressure. Continue with increase dose of amlodipine.  History of CVA Not on any medications.  Hypokalemia Replenished   Protein calorie malnutrition with Poor po intake Nutritionist consulted.  Diet: Regular DVT prophylaxis: Subcutaneous Lovenox  Code Status: Full code Family Communication: None at bedside. D/w daughter carolyn on the phone Disposition Plan: SNF in 24 hour if ck level lower.    Consultants:  None  Procedures:  None  Antibiotics:  None  HPI/Subjective: He is feeling well, denies abdominal pain. He has had loose BM. Tolerating diet.   Objective: Filed Vitals:   01/18/15 0519  BP: 116/76  Pulse: 110  Temp: 98.1 F (36.7 C)  Resp: 20    Intake/Output Summary (Last 24 hours) at 01/18/15 1016 Last data filed at 01/18/15 0600  Gross per 24 hour  Intake   3840 ml  Output   1075 ml  Net   2765 ml   Filed Weights   01/16/15 1100  Weight: 90.719 kg (200 lb)    Exam:   General:  Elderly male in no acute distress  HEENT: No pallor, moist oral mucosa, supple neck  Chest: Clear to auscultation bilaterally  CVS: Normal S1 and S2, no murmur or murmurs rub or gallop  GI: Soft, nondistended, nontender, bowel sounds present  Musculoskeletal: Warm, some weakness noted in proximal leg muscles, nontender  CNS: Alert and oriented   Data Reviewed: Basic Metabolic Panel:  Recent Labs Lab 01/16/15  5102 01/16/15 0534 01/17/15 0718 01/18/15 0517  NA 139 145 144 144  K 3.1* 3.2* 3.6 3.6  CL 104 108 113* 114*  CO2 19*  GLUCOSE 140* 122* 95 121*  BUN 23* 21* 21* 23*  CREATININE 0.95 0.97 1.02 0.90  CALCIUM 9.5 9.3 8.8* 8.7*   Liver Function Tests:  Recent Labs Lab 01/16/15 0053 01/17/15 0718 01/18/15 0517  AST 74* 118* 116*  ALT 29 42 46  ALKPHOS 42 35* 38  BILITOT 0.5 0.7 0.7   PROT 8.7* 7.1 6.9  ALBUMIN 3.6 2.7* 2.6*   No results for input(s): LIPASE, AMYLASE in the last 168 hours. No results for input(s): AMMONIA in the last 168 hours. CBC:  Recent Labs Lab 01/16/15 0053 01/16/15 0534  WBC 7.8 5.7  NEUTROABS 6.5  --   HGB 11.3* 10.8*  HCT 34.3* 32.8*  MCV 73.6* 73.2*  PLT 187 198   Cardiac Enzymes:  Recent Labs Lab 01/16/15 0100 01/16/15 0534 01/16/15 1921 01/17/15 0718 01/18/15 0517  CKTOTAL 3334* 5085* 5316* 3375* 2599*   BNP (last 3 results) No results for input(s): BNP in the last 8760 hours.  ProBNP (last 3 results) No results for input(s): PROBNP in the last 8760 hours.  CBG: No results for input(s): GLUCAP in the last 168 hours.  Recent Results (from the past 240 hour(s))  Urine culture     Status: None   Collection Time: 01/10/15  3:02 PM  Result Value Ref Range Status   Specimen Description URINE, CLEAN CATCH  Final   Special Requests NONE  Final   Culture   Final    MULTIPLE SPECIES PRESENT, SUGGEST RECOLLECTION Performed at [facility]    Report Status 01/12/2015 FINAL  Final  Culture, blood (routine x 2)     Status: None (Preliminary result)   Collection Time: 01/16/15 12:51 AM  Result Value Ref Range Status   Specimen Description BLOOD LEFT HAND  Final   Special Requests BOTTLES DRAWN AEROBIC AND ANAEROBIC 5CC  Final   Culture PENDING  Incomplete   Report Status PENDING  Incomplete  Culture, blood (routine x 2)     Status: None (Preliminary result)   Collection Time: 01/16/15  1:30 AM  Result Value Ref Range Status   Specimen Description BLOOD BLOOD LEFT FOREARM  Final   Special Requests BOTTLES DRAWN AEROBIC AND ANAEROBIC 5CC  Final   Culture   Final    NO GROWTH 1 DAY Performed at [facility]    Report Status PENDING  Incomplete  Urine culture     Status: None (Preliminary result)   Collection Time: 01/16/15  2:14 AM  Result Value Ref Range Status   Specimen Description URINE, CLEAN  CATCH  Final   Special Requests NONE  Final   Culture   Final    60,000 COLONIES/ml GRAM NEGATIVE RODS Performed at [facility]    Report Status PENDING  Incomplete     Studies: Ct Abdomen Pelvis W Contrast  01/17/2015   CLINICAL DATA:  Abdominal distention, evaluate for obstruction  EXAM: CT ABDOMEN AND PELVIS WITH CONTRAST  TECHNIQUE: Multidetector CT imaging of the abdomen and pelvis was performed using the standard protocol following bolus administration of intravenous contrast.  CONTRAST:  50mL OMNIPAQUE IOHEXOL 300 MG/ML SOLN, OMNIPAQUE IOHEXOL 300 MG/ML SOLN  COMPARISON:  01/17/2015  FINDINGS: Lower chest: Trace right pleural effusion. Bilateral mild lower lobe atelectasis.  Hepatobiliary: Negative  Pancreas: Negative  Spleen:  Negative  Adrenals/Urinary Tract: 7 mm low-attenuation lesion lower pole left kidney average attenuation value of -23. There is motion artifact. 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 No hydronephrosis on either side. Bladder is negative.  Stomach/Bowel: Diffuse large bowel dilatation. No evidence of twisting to suggest volvulus. Furthermore there is no abrupt caliber transition to suggest volvulus. Distal sigmoid and rectum measure 7.2 cm. Although there is little gas in the rectum, there is fluid in the mid to distal sigmoid and rectum. No evidence of obstruction. There are air-fluid levels throughout the large bowel, which is distended up to a diameter of 8.5 cm in the transverse colon.  Vascular/Lymphatic: No pathologic adenopathy. No evidence of aortic aneurysm.  Reproductive: Prostate enlarged at 7 cm  Other: Trace free fluid in the right upper quadrant  Musculoskeletal: Scoliosis of the thoracolumbar spine with associated degenerative changes. No acute findings.  IMPRESSION: No evidence of sigmoid volvulus. Findings appear most consistent with colonic ileus.  Significant enlargement of the prostate.  Correlate with PSA levels.   Electronically Signed    By: Esperanza Heir M.D.   On: 01/17/2015 17:44   Dg Abd Portable 1v  01/17/2015   CLINICAL DATA:  Abdominal distension. Right lower quadrant pain for 1 day.  EXAM: PORTABLE ABDOMEN - 1 VIEW  COMPARISON:  None.  FINDINGS: Single supine view of the abdomen and pelvis. Diffuse gaseous distention of bowel loops. Primarily colonic. Upper abdomen is excluded. No rectal gas identified. Prominent sigmoid gaseous distension. No pneumatosis.  IMPRESSION: Diffuse gaseous distention of bowel loops, primarily colonic. Absence of rectal gas, with prominent gas-filled sigmoid colon. Findings could represent colonic ileus or distal obstruction. Sigmoid volvulus could have this appearance.  Please note that the upper abdomen is excluded. Consider further evaluation with dedicated complete abdomen series versus CT.   Electronically Signed   By: Jeronimo Greaves M.D.   On: 01/17/2015 10:16    Scheduled Meds: . amLODipine  10 mg Oral Daily  . brimonidine  1 drop Both Eyes BID   And  . timolol  1 drop Both Eyes BID  . docusate sodium  100 mg Oral BID  . dorzolamide  1 drop Both Eyes TID  . enoxaparin (LOVENOX) injection  40 mg Subcutaneous Q24H  . polyethylene glycol  17 g Oral Daily   Continuous Infusions: . sodium chloride 150 mL/hr at 01/17/15 1200      Time spent: 25 minutes    Shanyia Stines A  [facility] Pager [phone]. If 7PM-7AM, please contact night-coverage at [email or web address], password [facility] 01/18/2015, 10:16 AM  LOS: 2 days

## 2015-01-19 ENCOUNTER — Inpatient Hospital Stay (HOSPITAL_COMMUNITY): Payer: Medicare PPO

## 2015-01-19 DIAGNOSIS — J9801 Acute bronchospasm: Secondary | ICD-10-CM | POA: Diagnosis present

## 2015-01-19 LAB — BASIC METABOLIC PANEL WITH GFR
Anion gap: 8 (ref 5–15)
BUN: 23 mg/dL — ABNORMAL HIGH (ref 6–20)
CO2: 24 mmol/L (ref 22–32)
Calcium: 8.9 mg/dL (ref 8.9–10.3)
Chloride: 111 mmol/L (ref 101–111)
Creatinine, Ser: 0.93 mg/dL (ref 0.61–1.24)
GFR calc Af Amer: >60 mL/min
GFR calc non Af Amer: >60 mL/min
Glucose, Bld: 135 mg/dL — ABNORMAL HIGH (ref 65–99)
Potassium: 3.3 mmol/L — ABNORMAL LOW (ref 3.5–5.1)
Sodium: 143 mmol/L (ref 135–145)

## 2015-01-19 LAB — BRAIN NATRIURETIC PEPTIDE: B Natriuretic Peptide: 93.4 pg/mL (ref 0.0–100.0)

## 2015-01-19 LAB — CK: CK TOTAL: 2555 U/L — AB (ref 49–397)

## 2015-01-19 MED ORDER — ALBUTEROL SULFATE (2.5 MG/3ML) 0.083% IN NEBU
2.5000 mg | INHALATION_SOLUTION | RESPIRATORY_TRACT | Status: DC | PRN
  Administered 2015-01-19: 2.5 mg via RESPIRATORY_TRACT
  Filled 2015-01-19: qty 3

## 2015-01-19 MED ORDER — METHYLPREDNISOLONE SODIUM SUCC 125 MG IJ SOLR
60.0000 mg | Freq: Two times a day (BID) | INTRAMUSCULAR | Status: DC
  Administered 2015-01-19 – 2015-01-20 (×3): 60 mg via INTRAVENOUS
  Filled 2015-01-19 (×4): qty 0.96

## 2015-01-19 MED ORDER — POTASSIUM CHLORIDE CRYS ER 20 MEQ PO TBCR
40.0000 meq | EXTENDED_RELEASE_TABLET | Freq: Two times a day (BID) | ORAL | Status: AC
  Administered 2015-01-19 (×2): 40 meq via ORAL
  Filled 2015-01-19 (×2): qty 2

## 2015-01-19 MED ORDER — IPRATROPIUM-ALBUTEROL 0.5-2.5 (3) MG/3ML IN SOLN
3.0000 mL | Freq: Four times a day (QID) | RESPIRATORY_TRACT | Status: DC
  Administered 2015-01-19 – 2015-01-20 (×4): 3 mL via RESPIRATORY_TRACT
  Filled 2015-01-19 (×5): qty 3

## 2015-01-19 NOTE — Progress Notes (Signed)
Notified NP Kirby-Graham of chest x-ray results. Will continue to monitor.

## 2015-01-19 NOTE — Progress Notes (Signed)
Pt having expiratory wheezing and some shortness of breath. Notified NP Kirby-Graham of [facility]. Will continue to monitor.

## 2015-01-19 NOTE — Progress Notes (Signed)
CSW assisting with d/c planning. FL2 in chart for MD signature. Pt has a ST Rehab bed at [facility] when stable for d/c.  Cori Razor LCSW [phone]

## 2015-01-19 NOTE — Progress Notes (Signed)
PT Cancellation Note  Patient Details Name: Makai Corris MRN: [id number] DOB: 08-10-1919   Cancelled Treatment:     Medical work up.  Pt already OOB in recliner.    Armando Reichert 01/19/2015, 2:57 PM

## 2015-01-19 NOTE — Progress Notes (Signed)
[facility] PROGRESS NOTE  Luman Basset NTZ:[id number] DOB: 09-21-1919 DOA: 01/15/2015 PCP: Devra Dopp, MD  Brief narrative 81 year old male living independently with history of CVA in 2011 and hypertension presented to the ED after sustaining a fall at home and was lying on the floor for over 8 hours. He was found to have lactic acidosis was markedly elevated CPK on presentation. One week back he was seen in the ED [place] after sustaining a fall at home with normal imaging and found to have UTI and discharged on Keflex. He saw his PCP 2 days back and was referred for physical therapy. Patient on the day of admission was going to the bathroom after waking up when his legs felt very weak and he slowly fell onto the ground but could not get up. He reports that he was on the ground floor all day as he was unable to get up and called his daughter. His daughter came to the house in the evening and found him on the floor. In the ED patient was found to be hypokalemic with elevated lactic acid and CK >3000. He was admitted for rhabdomyolysis and dehydration.  Assessment/Plan: Acute rhabdomyolysis Secondary to fall and being on the floor all day. CPK elevated up to 5300, now slowly trending down. Has low potassium and elevated AST. UA shows granular past and possible myoglobinuria.. Denies being on statin or taking any over-the-counter or herbal supplements. No recent viral illness. -Hold IV fluids due to mild interstitial edema.  -Ck 3375---2599--2555 -Repeat CK in am.  -Encourage oral intake.   Bronchospasm Vs pulmonary edema.  Chest x ray with hyperinflation, and mild interstitial edema.  Will stop IV fluids.  Will order schedule nebulizer.  Start IV solumedrol.  Check BNP and reevaluate patient to determine need for lasix. Difficult situation with elevated CK level.   Constipation and abdominal distension, CT colonic ileus.  Continue colace , add miralax.  CT showed  colonic ileus.  Has had Bowel movement, tolerating diet, denies abdominal pain.   Elevated lactic acid Secondary to dehydration. Repeat lactic acid resolved after IV hydration.  Weakness and frequent falls Patient complained of neck pain and right hip pain secondary to recent fall. Imaging were normal. He has bilateral proximal muscle weakness which is likely associated with elevated CPK.  Pain control with when necessary Tylenol.  Essential hypertension Elevated blood pressure. Continue with increase dose of amlodipine.  History of CVA Not on any medications.  Hypokalemia Replenished  Protein calorie malnutrition with Poor po intake Nutritionist consulted.  Diet: Regular DVT prophylaxis: Subcutaneous Lovenox  Code Status: Full code Family Communication: None at bedside. D/w daughter carolyn on the phone Disposition Plan: SNF when respiratory status stable, renal function stable.    Consultants:  None  Procedures:  None  Antibiotics:  None  HPI/Subjective: He is feeling well, denies dyspnea. Denies abdominal pain, he is eating well, having BM  Objective: Filed Vitals:   01/19/15 0340  BP: 142/78  Pulse: 96  Temp: 98.2 F (36.8 C)  Resp: 18    Intake/Output Summary (Last 24 hours) at 01/19/15 0904 Last data filed at 01/19/15 0600  Gross per 24 hour  Intake 1921.67 ml  Output    151 ml  Net 1770.67 ml   Filed Weights   01/16/15 1100  Weight: 90.719 kg (200 lb)    Exam:   General:  Elderly male in no acute distress  HEENT: No pallor, moist oral mucosa, supple neck  Chest: Bilateral wheezing, crackles.   CVS: Normal S1 and S2, no murmur or murmurs rub or gallop  GI: Soft, nondistended, nontender, bowel sounds present  Musculoskeletal: Warm, some weakness noted in proximal leg muscles, nontender  CNS: Alert and oriented   Data Reviewed: Basic Metabolic Panel:  Recent Labs Lab 01/16/15 0053 01/16/15 0534 01/17/15 0718 01/18/15 0517  01/19/15 0300  NA 139 145 144 144 143  K 3.1* 3.2* 3.6 3.6 3.3*  CL 104 108 113* 114* 111  CO2 19* 24  GLUCOSE 140* 122* 95 121* 135*  BUN 23* 21* 21* 23* 23*  CREATININE 0.95 0.97 1.02 0.90 0.93  CALCIUM 9.5 9.3 8.8* 8.7* 8.9   Liver Function Tests:  Recent Labs Lab 01/16/15 0053 01/17/15 0718 01/18/15 0517  AST 74* 118* 116*  ALT 29 42 46  ALKPHOS 42 35* 38  BILITOT 0.5 0.7 0.7  PROT 8.7* 7.1 6.9  ALBUMIN 3.6 2.7* 2.6*   No results for input(s): LIPASE, AMYLASE in the last 168 hours. No results for input(s): AMMONIA in the last 168 hours. CBC:  Recent Labs Lab 01/16/15 0053 01/16/15 0534  WBC 7.8 5.7  NEUTROABS 6.5  --   HGB 11.3* 10.8*  HCT 34.3* 32.8*  MCV 73.6* 73.2*  PLT 187 198   Cardiac Enzymes:  Recent Labs Lab 01/16/15 0534 01/16/15 1921 01/17/15 0718 01/18/15 0517 01/19/15 0300  CKTOTAL 5085* 5316* 3375* 2599* 2555*   BNP (last 3 results) No results for input(s): BNP in the last 8760 hours.  ProBNP (last 3 results) No results for input(s): PROBNP in the last 8760 hours.  CBG: No results for input(s): GLUCAP in the last 168 hours.  Recent Results (from the past 240 hour(s))  Urine culture     Status: None   Collection Time: 01/10/15  3:02 PM  Result Value Ref Range Status   Specimen Description URINE, CLEAN CATCH  Final   Special Requests NONE  Final   Culture   Final    MULTIPLE SPECIES PRESENT, SUGGEST RECOLLECTION Performed at [facility]    Report Status 01/12/2015 FINAL  Final  Culture, blood (routine x 2)     Status: None (Preliminary result)   Collection Time: 01/16/15 12:51 AM  Result Value Ref Range Status   Specimen Description BLOOD LEFT HAND  Final   Special Requests BOTTLES DRAWN AEROBIC AND ANAEROBIC 5CC  Final   Culture   Final    NO GROWTH 2 DAYS Performed at [facility] [place]    Report Status PENDING  Incomplete  Culture, blood (routine x 2)     Status: None (Preliminary result)    Collection Time: 01/16/15  1:30 AM  Result Value Ref Range Status   Specimen Description BLOOD BLOOD LEFT FOREARM  Final   Special Requests BOTTLES DRAWN AEROBIC AND ANAEROBIC 5CC  Final   Culture   Final    NO GROWTH 2 DAYS Performed at [facility]    Report Status PENDING  Incomplete  Urine culture     Status: None   Collection Time: 01/16/15  2:14 AM  Result Value Ref Range Status   Specimen Description URINE, CLEAN CATCH  Final   Special Requests NONE  Final   Culture   Final    60,000 COLONIES/ml MORGANELLA MORGANII Performed at [facility]    Report Status 01/18/2015 FINAL  Final   Organism ID, Bacteria MORGANELLA MORGANII  Final      Susceptibility  Morganella morganii - MIC*    AMPICILLIN >=32 RESISTANT Resistant     CEFAZOLIN >=64 RESISTANT Resistant     CEFTRIAXONE <=1 SENSITIVE Sensitive     CIPROFLOXACIN <=0.25 SENSITIVE Sensitive     GENTAMICIN 2 SENSITIVE Sensitive     IMIPENEM 1 SENSITIVE Sensitive     NITROFURANTOIN 64 RESISTANT Resistant     TRIMETH/SULFA <=20 SENSITIVE Sensitive     AMPICILLIN/SULBACTAM >=32 RESISTANT Resistant     PIP/TAZO <=4 SENSITIVE Sensitive     * 60,000 COLONIES/ml MORGANELLA MORGANII     Studies: Ct Abdomen Pelvis W Contrast  01/17/2015   CLINICAL DATA:  Abdominal distention, evaluate for obstruction  EXAM: CT ABDOMEN AND PELVIS WITH CONTRAST  TECHNIQUE: Multidetector CT imaging of the abdomen and pelvis was performed using the standard protocol following bolus administration of intravenous contrast.  CONTRAST:  50mL OMNIPAQUE IOHEXOL 300 MG/ML SOLN, OMNIPAQUE IOHEXOL 300 MG/ML SOLN  COMPARISON:  01/17/2015  FINDINGS: Lower chest: Trace right pleural effusion. Bilateral mild lower lobe atelectasis.  Hepatobiliary: Negative  Pancreas: Negative  Spleen: Negative  Adrenals/Urinary Tract: 7 mm low-attenuation lesion lower pole left kidney average attenuation value of -23. There is motion artifact. 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 No hydronephrosis on either side. Bladder is negative.  Stomach/Bowel: Diffuse large bowel dilatation. No evidence of twisting to suggest volvulus. Furthermore there is no abrupt caliber transition to suggest volvulus. Distal sigmoid and rectum measure 7.2 cm. Although there is little gas in the rectum, there is fluid in the mid to distal sigmoid and rectum. No evidence of obstruction. There are air-fluid levels throughout the large bowel, which is distended up to a diameter of 8.5 cm in the transverse colon.  Vascular/Lymphatic: No pathologic adenopathy. No evidence of aortic aneurysm.  Reproductive: Prostate enlarged at 7 cm  Other: Trace free fluid in the right upper quadrant  Musculoskeletal: Scoliosis of the thoracolumbar spine with associated degenerative changes. No acute findings.  IMPRESSION: No evidence of sigmoid volvulus. Findings appear most consistent with colonic ileus.  Significant enlargement of the prostate.  Correlate with PSA levels.   Electronically Signed   By: Esperanza Heir M.D.   On: 01/17/2015 17:44   Dg Chest Port 1 View  01/19/2015   CLINICAL DATA:  Acute onset of shortness of breath and wheezing. Initial encounter.  EXAM: PORTABLE CHEST 1 VIEW  COMPARISON:  Chest radiograph performed 01/16/2015  FINDINGS: The lungs are hypoexpanded. Vascular congestion is noted, with increased interstitial markings, raising question for mild interstitial edema. A small right pleural effusion is suspected. No pneumothorax is seen.  The cardiomediastinal silhouette is borderline enlarged. No acute osseous abnormalities are seen.  IMPRESSION: Lungs hypoexpanded. Vascular congestion and borderline cardiomegaly, with increased interstitial markings, raising question for mild interstitial edema. Suspect small right pleural effusion.   Electronically Signed   By: Roanna Raider M.D.   On: 01/19/2015 01:53   Dg Abd Portable 1v  01/17/2015   CLINICAL DATA:  Abdominal distension. Right  lower quadrant pain for 1 day.  EXAM: PORTABLE ABDOMEN - 1 VIEW  COMPARISON:  None.  FINDINGS: Single supine view of the abdomen and pelvis. Diffuse gaseous distention of bowel loops. Primarily colonic. Upper abdomen is excluded. No rectal gas identified. Prominent sigmoid gaseous distension. No pneumatosis.  IMPRESSION: Diffuse gaseous distention of bowel loops, primarily colonic. Absence of rectal gas, with prominent gas-filled sigmoid colon. Findings could represent colonic ileus or distal obstruction. Sigmoid volvulus could have this appearance.  Please note  that the upper abdomen is excluded. Consider further evaluation with dedicated complete abdomen series versus CT.   Electronically Signed   By: Jeronimo Greaves M.D.   On: 01/17/2015 10:16    Scheduled Meds: . amLODipine  10 mg Oral Daily  . brimonidine  1 drop Both Eyes BID   And  . timolol  1 drop Both Eyes BID  . docusate sodium  100 mg Oral BID  . dorzolamide  1 drop Both Eyes TID  . enoxaparin (LOVENOX) injection  40 mg Subcutaneous Q24H  . feeding supplement (ENSURE ENLIVE)  237 mL Oral q morning - 10a  . ipratropium-albuterol  3 mL Nebulization Q6H  . methylPREDNISolone (SOLU-MEDROL) injection  60 mg Intravenous Q12H  . polyethylene glycol  17 g Oral Daily  . potassium chloride  40 mEq Oral BID   Continuous Infusions:      Time spent: 25 minutes    Talynn Lebon A  [facility] Pager [phone]. If 7PM-7AM, please contact night-coverage at [email or web address], password [facility] 01/19/2015, 9:04 AM  LOS: 3 days

## 2015-01-20 DIAGNOSIS — R296 Repeated falls: Secondary | ICD-10-CM

## 2015-01-20 DIAGNOSIS — E46 Unspecified protein-calorie malnutrition: Secondary | ICD-10-CM

## 2015-01-20 DIAGNOSIS — M542 Cervicalgia: Secondary | ICD-10-CM

## 2015-01-20 DIAGNOSIS — Y92009 Unspecified place in unspecified non-institutional (private) residence as the place of occurrence of the external cause: Secondary | ICD-10-CM

## 2015-01-20 DIAGNOSIS — K5900 Constipation, unspecified: Secondary | ICD-10-CM

## 2015-01-20 DIAGNOSIS — T796XXD Traumatic ischemia of muscle, subsequent encounter: Secondary | ICD-10-CM

## 2015-01-20 DIAGNOSIS — E860 Dehydration: Secondary | ICD-10-CM

## 2015-01-20 DIAGNOSIS — Z8673 Personal history of transient ischemic attack (TIA), and cerebral infarction without residual deficits: Secondary | ICD-10-CM

## 2015-01-20 DIAGNOSIS — J9801 Acute bronchospasm: Secondary | ICD-10-CM

## 2015-01-20 DIAGNOSIS — T796XXA Traumatic ischemia of muscle, initial encounter: Secondary | ICD-10-CM

## 2015-01-20 DIAGNOSIS — W19XXXA Unspecified fall, initial encounter: Secondary | ICD-10-CM

## 2015-01-20 DIAGNOSIS — E872 Acidosis: Secondary | ICD-10-CM

## 2015-01-20 DIAGNOSIS — W19XXXD Unspecified fall, subsequent encounter: Secondary | ICD-10-CM

## 2015-01-20 DIAGNOSIS — I10 Essential (primary) hypertension: Secondary | ICD-10-CM

## 2015-01-20 LAB — BASIC METABOLIC PANEL
Anion gap: 7 (ref 5–15)
BUN: 23 mg/dL — AB (ref 6–20)
CHLORIDE: 113 mmol/L — AB (ref 101–111)
CO2: 25 mmol/L (ref 22–32)
CREATININE: 0.87 mg/dL (ref 0.61–1.24)
Calcium: 9.4 mg/dL (ref 8.9–10.3)
GFR calc Af Amer: >60 mL/min (ref >60)
GFR calc non Af Amer: >60 mL/min (ref >60)
Glucose, Bld: 178 mg/dL — ABNORMAL HIGH (ref 65–99)
Potassium: 3.8 mmol/L (ref 3.5–5.1)
SODIUM: 145 mmol/L (ref 135–145)

## 2015-01-20 LAB — CK: Total CK: 748 U/L — ABNORMAL HIGH (ref 49–397)

## 2015-01-20 MED ORDER — AMLODIPINE BESYLATE 10 MG PO TABS: 10.0000 mg | ORAL_TABLET | Freq: Every day | ORAL | Status: DC

## 2015-01-20 MED ORDER — FOSFOMYCIN TROMETHAMINE 3 G PO PACK
3.0000 g | PACK | Freq: Once | ORAL | Status: AC
  Administered 2015-01-20: 3 g via ORAL
  Filled 2015-01-20: qty 3

## 2015-01-20 MED ORDER — ENSURE ENLIVE PO LIQD: 237.0000 mL | Freq: Every morning | ORAL | Status: AC

## 2015-01-20 MED ORDER — IPRATROPIUM-ALBUTEROL 0.5-2.5 (3) MG/3ML IN SOLN: 3.0000 mL | Freq: Four times a day (QID) | RESPIRATORY_TRACT | Status: DC | PRN

## 2015-01-20 MED ORDER — POLYETHYLENE GLYCOL 3350 17 G PO PACK: 17.0000 g | PACK | Freq: Every day | ORAL | Status: DC

## 2015-01-20 MED ORDER — DOCUSATE SODIUM 100 MG PO CAPS: 100.0000 mg | ORAL_CAPSULE | Freq: Two times a day (BID) | ORAL | Status: AC

## 2015-01-20 NOTE — Care Management Important Message (Signed)
Important Message  Patient Details  Name: Mervin Nitzsche MRN: [id number] Date of Birth: March 07, 1920   Medicare Important Message Given:  Yes-second notification given    Haskell Flirt 01/20/2015, 12:17 PMImportant Message  Patient Details  Name: Kit Deluca MRN: [id number] Date of Birth: [birth year]   Medicare Important Message Given:  Yes-second notification given    Haskell Flirt 01/20/2015, 12:17 PM

## 2015-01-20 NOTE — Discharge Summary (Signed)
Physician Discharge Summary  Jack Zuniga:[id number] DOB: 08/14/1919 DOA: 01/15/2015  PCP: Devra Dopp, MD  Admit date: 01/15/2015 Discharge date: 01/20/2015   Recommendations for Outpatient Follow-Up:   1. Recommend repeat BMET in 1 week to ensure creatinine remains WNL.   2. Encourage PO fluid intake.   Discharge Diagnosis:   Principal Problem:    Rhabdomyolysis in the setting of fall/prolonged immobility Active Problems:    Dehydration    Elevated lactic acid level    Essential hypertension    History of CVA (cerebrovascular accident)    Uncontrolled hypertension    Protein-calorie malnutrition (HCC)    Constipation    Bronchospasm    Frequent falls    Neck pain    Morganella Morganii UTI   Discharge disposition:  SNF: [facility].  Discharge Condition: Improved.  Diet recommendation: Regular.   History of Present Illness:   Jack Zuniga is an 81 y.o. male living independently with history of CVA in 2011 and hypertension who was admitted 01/15/15 after sustaining a fall at home and was lying on the floor for over 8 hours. He was found to have lactic acidosis was markedly elevated CPK on presentation. One week prior to this admission, he was seen in the ED after sustaining a fall at home with normal imaging, and was found to have UTI and discharged on Keflex.  Hospital Course by Problem:   Principal problem:  Acute rhabdomyolysis - Secondary to fall and being on the floor all day. CPK elevated up to 5300, now slowly trending down.  - Initially hydrated, IV fluids stopped 01/19/15 due to to the development of interstitial edema.  - CK 748 today. - Encourage oral intake.   Active problems:  Morganella Morganii UTI - Treated with Keflex prior to admission, organism resistant. - Will give 1 dose fosfomycin prior to D/C.  Bronchospasm secondary to acute pulmonary edema from volume overload - Chest x ray 01/19/15 with hyperinflation, and mild  interstitial edema. IV fluids stopped. - Given nebulized broncho-dilator treatments and IV solumedrol, with resolution.  - BNP not elevated (93.4), so doubt there is any evidence of underlying CHF.  Constipation and abdominal distension, CT colonic ileus.  - CT showed colonic ileus.  - Continue colace and miralax.  - Has had Bowel movement, tolerating diet, denies abdominal pain.   Elevated lactic acid - Secondary to dehydration. Repeat lactic acid resolved after IV hydration.  Weakness and frequent falls / Neck pain - Patient complained of neck pain and right hip pain secondary to recent fall. Imaging was normal.  - He has bilateral proximal muscle weakness which is likely associated with elevated CPK.  - Pain control with when necessary with Tylenol. - Physical therapy evaluation performed. SNF recommended.  Essential hypertension - Continue with increased dose of amlodipine.  History of CVA - Continue risk factor modification. Control blood pressure.  Hypokalemia - Replenished.  Protein calorie malnutrition with Poor po intake - Seen by dietitian 01/18/15. Supplements ordered.   Medical Consultants:    None.   Discharge Exam:   Filed Vitals:   01/20/15 0608  BP: 158/89  Pulse: 94  Temp: 98.2 F (36.8 C)  Resp: 20   Filed Vitals:   01/19/15 2105 01/20/15 0230 01/20/15 0608 01/20/15 0832  BP: 159/95  158/89   Pulse: 107  94   Temp: 98.3 F (36.8 C)  98.2 F (36.8 C)   TempSrc: Axillary  Axillary   Resp: 16  20   Height:  Weight:      SpO2: 97% 94% 97% 97%    Gen:  NAD Cardiovascular:  RRR, No M/R/G Respiratory: Lungs CTAB, no wheeze Gastrointestinal: Abdomen softly distended, NT/ND with normal active bowel sounds. Extremities: No C/E/C   The results of significant diagnostics from this hospitalization (including imaging, microbiology, ancillary and laboratory) are listed below for reference.     Procedures and Diagnostic Studies:    Dg Chest 2 View  01/16/2015   CLINICAL DATA:  Shortness of breath, hypertension. Fall, found down on floor by family member.  EXAM: CHEST  2 VIEW  COMPARISON:  None.  FINDINGS: Cardiac silhouette is mildly enlarged, mediastinal silhouette is nonsuspicious. Mildly elevated RIGHT hemidiaphragm. Chronic appearing interstitial changes without pleural effusion or focal consolidation. Probable calcified RIGHT hilar lymph nodes. Small nodular densities seen on the lateral RIGHT radiographs suggest granulomas or pulmonary vessel en face. LEFT apical pleural thickening. No pneumothorax. High-riding RIGHT humeral head compatible with old rotator cuff injury. Soft tissue planes are nonsuspicious.  IMPRESSION: Borderline cardiomegaly.  No acute pulmonary process.  Mild chronic appearing interstitial changes. Probable granulomatous disease.   Electronically Signed   By: Awilda Metro M.D.   On: 01/16/2015 02:09   Ct Head Wo Contrast  01/10/2015   CLINICAL DATA:  Patient status post fall. Posterior neck pain. No reported loss of consciousness.  EXAM: CT HEAD WITHOUT CONTRAST  CT CERVICAL SPINE WITHOUT CONTRAST  TECHNIQUE: Multidetector CT imaging of the head and cervical spine was performed following the standard protocol without intravenous contrast. Multiplanar CT image reconstructions of the cervical spine were also generated.  COMPARISON:  None.  FINDINGS: CT HEAD FINDINGS  Ventricles and sulci are prominent compatible with atrophy. Extensive periventricular and subcortical white matter hypodensity compatible with chronic small vessel ischemic changes. Encephalomalacia within the left occipital lobe. Paranasal sinuses are unremarkable. Mastoid air cells are unremarkable. The calvarium is intact. No evidence for intracranial hemorrhage or mass effect.  CT CERVICAL SPINE FINDINGS  There is focal kyphosis of the cervical spine at the C5 level with fusion of the C4-5 and C5-6 levels. Multilevel facet degenerative  changes. Craniocervical junction is intact. No evidence for acute cervical spine fracture. Degenerative changes the posterior spinous processes involving the T1 level.  IMPRESSION: Left occipital lobe encephalomalacia, favored represent subacute to chronic infarct.  No acute intracranial hemorrhage.  No acute cervical spine fracture.  Multilevel degenerative changes and associated fusion of the cervical spine.   Electronically Signed   By: Annia Belt M.D.   On: 01/10/2015 15:44   Ct Cervical Spine Wo Contrast  01/10/2015   CLINICAL DATA:  Patient status post fall. Posterior neck pain. No reported loss of consciousness.  EXAM: CT HEAD WITHOUT CONTRAST  CT CERVICAL SPINE WITHOUT CONTRAST  TECHNIQUE: Multidetector CT imaging of the head and cervical spine was performed following the standard protocol without intravenous contrast. Multiplanar CT image reconstructions of the cervical spine were also generated.  COMPARISON:  None.  FINDINGS: CT HEAD FINDINGS  Ventricles and sulci are prominent compatible with atrophy. Extensive periventricular and subcortical white matter hypodensity compatible with chronic small vessel ischemic changes. Encephalomalacia within the left occipital lobe. Paranasal sinuses are unremarkable. Mastoid air cells are unremarkable. The calvarium is intact. No evidence for intracranial hemorrhage or mass effect.  CT CERVICAL SPINE FINDINGS  There is focal kyphosis of the cervical spine at the C5 level with fusion of the C4-5 and C5-6 levels. Multilevel facet degenerative changes. Craniocervical junction is intact. No evidence  for acute cervical spine fracture. Degenerative changes the posterior spinous processes involving the T1 level.  IMPRESSION: Left occipital lobe encephalomalacia, favored represent subacute to chronic infarct.  No acute intracranial hemorrhage.  No acute cervical spine fracture.  Multilevel degenerative changes and associated fusion of the cervical spine.   Electronically  Signed   By: Annia Belt M.D.   On: 01/10/2015 15:44   Ct Abdomen Pelvis W Contrast  01/17/2015   CLINICAL DATA:  Abdominal distention, evaluate for obstruction  EXAM: CT ABDOMEN AND PELVIS WITH CONTRAST  TECHNIQUE: Multidetector CT imaging of the abdomen and pelvis was performed using the standard protocol following bolus administration of intravenous contrast.  CONTRAST:  50mL OMNIPAQUE IOHEXOL 300 MG/ML SOLN, OMNIPAQUE IOHEXOL 300 MG/ML SOLN  COMPARISON:  01/17/2015  FINDINGS: Lower chest: Trace right pleural effusion. Bilateral mild lower lobe atelectasis.  Hepatobiliary: Negative  Pancreas: Negative  Spleen: Negative  Adrenals/Urinary Tract: 7 mm low-attenuation lesion lower pole left kidney average attenuation value of -23. There is motion artifact. 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 No hydronephrosis on either side. Bladder is negative.  Stomach/Bowel: Diffuse large bowel dilatation. No evidence of twisting to suggest volvulus. Furthermore there is no abrupt caliber transition to suggest volvulus. Distal sigmoid and rectum measure 7.2 cm. Although there is little gas in the rectum, there is fluid in the mid to distal sigmoid and rectum. No evidence of obstruction. There are air-fluid levels throughout the large bowel, which is distended up to a diameter of 8.5 cm in the transverse colon.  Vascular/Lymphatic: No pathologic adenopathy. No evidence of aortic aneurysm.  Reproductive: Prostate enlarged at 7 cm  Other: Trace free fluid in the right upper quadrant  Musculoskeletal: Scoliosis of the thoracolumbar spine with associated degenerative changes. No acute findings.  IMPRESSION: No evidence of sigmoid volvulus. Findings appear most consistent with colonic ileus.  Significant enlargement of the prostate.  Correlate with PSA levels.   Electronically Signed   By: Esperanza Heir M.D.   On: 01/17/2015 17:44   Dg Chest Port 1 View  01/19/2015   CLINICAL DATA:  Acute onset of shortness of breath  and wheezing. Initial encounter.  EXAM: PORTABLE CHEST 1 VIEW  COMPARISON:  Chest radiograph performed 01/16/2015  FINDINGS: The lungs are hypoexpanded. Vascular congestion is noted, with increased interstitial markings, raising question for mild interstitial edema. A small right pleural effusion is suspected. No pneumothorax is seen.  The cardiomediastinal silhouette is borderline enlarged. No acute osseous abnormalities are seen.  IMPRESSION: Lungs hypoexpanded. Vascular congestion and borderline cardiomegaly, with increased interstitial markings, raising question for mild interstitial edema. Suspect small right pleural effusion.   Electronically Signed   By: Roanna Raider M.D.   On: 01/19/2015 01:53   Dg Abd Portable 1v  01/17/2015   CLINICAL DATA:  Abdominal distension. Right lower quadrant pain for 1 day.  EXAM: PORTABLE ABDOMEN - 1 VIEW  COMPARISON:  None.  FINDINGS: Single supine view of the abdomen and pelvis. Diffuse gaseous distention of bowel loops. Primarily colonic. Upper abdomen is excluded. No rectal gas identified. Prominent sigmoid gaseous distension. No pneumatosis.  IMPRESSION: Diffuse gaseous distention of bowel loops, primarily colonic. Absence of rectal gas, with prominent gas-filled sigmoid colon. Findings could represent colonic ileus or distal obstruction. Sigmoid volvulus could have this appearance.  Please note that the upper abdomen is excluded. Consider further evaluation with dedicated complete abdomen series versus CT.   Electronically Signed   By: Jeronimo Greaves M.D.   On:  01/17/2015 10:16     Labs:   Basic Metabolic Panel:  Recent Labs Lab 01/16/15 0534 01/17/15 0718 01/18/15 0517 01/19/15 0300 01/20/15 0531  NA 145 144 144 143 145  K 3.2* 3.6 3.6 3.3* 3.8  CL 108 113* 114* 111 113*  CO2 27 25 19* 24 25  GLUCOSE 122* 95 121* 135* 178*  BUN 21* 21* 23* 23* 23*  CREATININE 0.97 1.02 0.90 0.93 0.87  CALCIUM 9.3 8.8* 8.7* 8.9 9.4   GFR Estimated Creatinine  Clearance: 61.2 mL/min (by C-G formula based on Cr of 0.87). Liver Function Tests:  Recent Labs Lab 01/16/15 0053 01/17/15 0718 01/18/15 0517  AST 74* 118* 116*  ALT 29 42 46  ALKPHOS 42 35* 38  BILITOT 0.5 0.7 0.7  PROT 8.7* 7.1 6.9  ALBUMIN 3.6 2.7* 2.6*   CBC:  Recent Labs Lab 01/16/15 0053 01/16/15 0534  WBC 7.8 5.7  NEUTROABS 6.5  --   HGB 11.3* 10.8*  HCT 34.3* 32.8*  MCV 73.6* 73.2*  PLT 187 198   Cardiac Enzymes:  Recent Labs Lab 01/16/15 1921 01/17/15 0718 01/18/15 0517 01/19/15 0300 01/20/15 0531  CKTOTAL 5316* 3375* 2599* 2555* 748*   Microbiology Recent Results (from the past 240 hour(s))  Urine culture     Status: None   Collection Time: 01/10/15  3:02 PM  Result Value Ref Range Status   Specimen Description URINE, CLEAN CATCH  Final   Special Requests NONE  Final   Culture   Final    MULTIPLE SPECIES PRESENT, SUGGEST RECOLLECTION Performed at [facility]    Report Status 01/12/2015 FINAL  Final  Culture, blood (routine x 2)     Status: None (Preliminary result)   Collection Time: 01/16/15 12:51 AM  Result Value Ref Range Status   Specimen Description BLOOD LEFT HAND  Final   Special Requests BOTTLES DRAWN AEROBIC AND ANAEROBIC 5CC  Final   Culture   Final    NO GROWTH 3 DAYS Performed at [facility]    Report Status PENDING  Incomplete  Culture, blood (routine x 2)     Status: None (Preliminary result)   Collection Time: 01/16/15  1:30 AM  Result Value Ref Range Status   Specimen Description BLOOD BLOOD LEFT FOREARM  Final   Special Requests BOTTLES DRAWN AEROBIC AND ANAEROBIC 5CC  Final   Culture   Final    NO GROWTH 3 DAYS Performed at [facility]    Report Status PENDING  Incomplete  Urine culture     Status: None   Collection Time: 01/16/15  2:14 AM  Result Value Ref Range Status   Specimen Description URINE, CLEAN CATCH  Final   Special Requests NONE  Final   Culture   Final    60,000 COLONIES/ml  MORGANELLA MORGANII Performed at [facility]    Report Status 01/18/2015 FINAL  Final   Organism ID, Bacteria MORGANELLA MORGANII  Final      Susceptibility   Morganella morganii - MIC*    AMPICILLIN >=32 RESISTANT Resistant     CEFAZOLIN >=64 RESISTANT Resistant     CEFTRIAXONE <=1 SENSITIVE Sensitive     CIPROFLOXACIN <=0.25 SENSITIVE Sensitive     GENTAMICIN 2 SENSITIVE Sensitive     IMIPENEM 1 SENSITIVE Sensitive     NITROFURANTOIN 64 RESISTANT Resistant     TRIMETH/SULFA <=20 SENSITIVE Sensitive     AMPICILLIN/SULBACTAM >=32 RESISTANT Resistant     PIP/TAZO <=4 SENSITIVE Sensitive     *  60,000 COLONIES/ml MORGANELLA MORGANII     Discharge Instructions:   Discharge Instructions    Call MD for:  difficulty breathing, headache or visual disturbances    Complete by:  As directed      Call MD for:  extreme fatigue    Complete by:  As directed      Call MD for:  severe uncontrolled pain    Complete by:  As directed      Diet general    Complete by:  As directed      Increase activity slowly    Complete by:  As directed      Walk with assistance    Complete by:  As directed      Walker     Complete by:  As directed             Medication List    STOP taking these medications        cephALEXin 500 MG capsule  Commonly known as:  KEFLEX      TAKE these medications        acetaminophen 325 MG tablet  Commonly known as:  TYLENOL  Take 650 mg by mouth every 6 (six) hours as needed (for pain.).     amLODipine 5 MG tablet  Commonly known as:  NORVASC  Take 5 mg by mouth daily.     COMBIGAN 0.2-0.5 % ophthalmic solution  Generic drug:  brimonidine-timolol  Place 1 drop into both eyes 2 (two) times daily.     docusate sodium 100 MG capsule  Commonly known as:  COLACE  Take 1 capsule (100 mg total) by mouth 2 (two) times daily.     dorzolamide 2 % ophthalmic solution  Commonly known as:  TRUSOPT  Place 1 drop into both eyes 3 (three) times daily.       feeding supplement (ENSURE ENLIVE) Liqd  Take 237 mLs by mouth every morning.     ONE-A-DAY MENS 50+ ADVANTAGE Tabs  Take 1 tablet by mouth daily.     polyethylene glycol packet  Commonly known as:  MIRALAX / GLYCOLAX  Take 17 g by mouth daily.          Time coordinating discharge: 35 minutes.  Signed:  Schylar Wuebker  Pager [phone] [facility] 01/20/2015, 10:07 AM

## 2015-01-20 NOTE — Clinical Social Work Placement (Signed)
   CLINICAL SOCIAL WORK PLACEMENT  NOTE  Date:  01/20/2015  Patient Details  Name: Arnette Lanoux MRN: [id number] Date of Birth: May 12, 1919  Clinical Social Work is seeking post-discharge placement for this patient at the Skilled  Nursing Facility level of care (*CSW will initial, date and re-position this form in  chart as items are completed):  Yes   Patient/family provided with [facility] Clinical Social Work Department's list of facilities offering this level of care within the geographic area requested by the patient (or if unable, by the patient's family).  Yes   Patient/family informed of their freedom to choose among providers that offer the needed level of care, that participate in Medicare, Medicaid or managed care program needed by the patient, have an available bed and are willing to accept the patient.  Yes   Patient/family informed of [facility]'s ownership interest in [facility] and [facility], as well as of the fact that they are under no obligation to receive care at these facilities.  PASRR submitted to EDS on 01/17/15     PASRR number received on 01/17/15     Existing PASRR number confirmed on       FL2 transmitted to all facilities in geographic area requested by pt/family on 01/17/15     FL2 transmitted to all facilities within larger geographic area on       Patient informed that his/her managed care company has contracts with or will negotiate with certain facilities, including the following:        Yes   Patient/family informed of bed offers received.  Patient chooses bed at [facility]     Physician recommends and patient chooses bed at      Patient to be transferred to [facility] on 01/20/15.  Patient to be transferred to facility by [facility]     Patient family notified on 01/20/15 of transfer.  Name of family member notified:  Daughter     PHYSICIAN       Additional Comment: Pt / daughter are in agreement with d/c to [facility] today.  [facility] transport is required. Pt / daughter are aware that out of pocket costs may be associated with [facility] transport. NSG reviewed d/c summary, scripts, avs. Scripts included in d/c packet. D/c summary sent to SNF for review prior to d/c. [employer] medicare provided authorization for SNF prior to d/c.     _______________________________________________ Royetta Asal, LCSW  [phone]  01/20/2015, 4:16 PM

## 2015-01-20 NOTE — Progress Notes (Signed)
Pt has a SNF bed at [facility] today if stable for d/c.   Cori Razor LCSW [phone]

## 2015-01-20 NOTE — Progress Notes (Signed)
1400 report called to sheri at [facility]   D [employer]

## 2015-01-20 NOTE — Progress Notes (Signed)
Physical Therapy Treatment Patient Details Name: Jack Zuniga MRN: [id number] DOB: March 24, 1920 Today's Date: 01/20/2015    History of Present Illness 81 yo male admitted with rhabdomyolysis. Hx of CVA. Pt is from home alone.     PT Comments    Assisted pt OOB to [facility] due to uncontrolled loose stools.  Assisted off BSC then amb a limited distance in hallway.    Follow Up Recommendations  SNF (via ambulance)     Equipment Recommendations       Recommendations for Other Services       Precautions / Restrictions Precautions Precautions: Fall Restrictions Weight Bearing Restrictions: No    Mobility  Bed Mobility Overal bed mobility: Needs Assistance Bed Mobility: Supine to Sit     Supine to sit: Mod assist;HOB elevated     General bed mobility comments: Assist for trunk and LEs. Increased time. Utilized bedpad to aid with scooting.   Transfers Overall transfer level: Needs assistance Equipment used: Rolling walker (2 wheeled) Transfers: Sit to/from [employer] Sit to Stand: Min assist;Mod assist Stand pivot transfers: Min assist;Mod assist       General transfer comment: 50% VC's on proper tech and hand placement   Ambulation/Gait Ambulation/Gait assistance: Min assist Ambulation Distance (Feet): 25 Feet Assistive device: Rolling walker (2 wheeled) Gait Pattern/deviations: Step-to pattern;Step-through pattern;Decreased step length - right;Decreased step length - left;Shuffle;Trunk flexed     General Gait Details: slow shuffled gait.  25% VC's on proper walker to self distance and increase posture.     Stairs            Wheelchair Mobility    Modified Rankin (Stroke Patients Only)       Balance                                    Cognition Arousal/Alertness: Awake/alert Behavior During Therapy: WFL for tasks assessed/performed Overall Cognitive Status: Within Functional Limits for tasks assessed                      Exercises      General Comments        Pertinent Vitals/Pain Pain Assessment: No/denies pain    Home Living                      Prior Function            PT Goals (current goals can now be found in the care plan section) Progress towards PT goals: Progressing toward goals    Frequency  Min 3X/week    PT Plan      Co-evaluation             End of Session Equipment Utilized During Treatment: Gait belt Activity Tolerance: Patient tolerated treatment well Patient left: in chair;with call bell/phone within reach;with chair alarm set     Time: 1030-1100 PT Time Calculation (min) (ACUTE ONLY): 30 min  Charges:  $Gait Training: 8-22 mins $Therapeutic Activity: 8-22 mins                    G Codes:      Felecia Shelling  PTA [facility]  Acute  Rehab Pager      [phone]

## 2015-01-21 ENCOUNTER — Encounter: Payer: Self-pay | Admitting: Adult Health

## 2015-01-21 ENCOUNTER — Non-Acute Institutional Stay (SKILLED_NURSING_FACILITY): Payer: Medicare PPO | Admitting: Adult Health

## 2015-01-21 DIAGNOSIS — R531 Weakness: Secondary | ICD-10-CM | POA: Diagnosis not present

## 2015-01-21 DIAGNOSIS — E46 Unspecified protein-calorie malnutrition: Secondary | ICD-10-CM

## 2015-01-21 DIAGNOSIS — I10 Essential (primary) hypertension: Secondary | ICD-10-CM | POA: Diagnosis not present

## 2015-01-21 DIAGNOSIS — N390 Urinary tract infection, site not specified: Secondary | ICD-10-CM | POA: Diagnosis not present

## 2015-01-21 DIAGNOSIS — D509 Iron deficiency anemia, unspecified: Secondary | ICD-10-CM

## 2015-01-21 DIAGNOSIS — T796XXD Traumatic ischemia of muscle, subsequent encounter: Secondary | ICD-10-CM | POA: Diagnosis not present

## 2015-01-21 DIAGNOSIS — K5900 Constipation, unspecified: Secondary | ICD-10-CM

## 2015-01-21 LAB — CULTURE, BLOOD (ROUTINE X 2)
CULTURE: NO GROWTH
Culture: NO GROWTH

## 2015-01-28 ENCOUNTER — Non-Acute Institutional Stay (SKILLED_NURSING_FACILITY): Payer: Medicare PPO | Admitting: Internal Medicine

## 2015-01-28 ENCOUNTER — Encounter: Payer: Self-pay | Admitting: Internal Medicine

## 2015-01-28 DIAGNOSIS — H269 Unspecified cataract: Secondary | ICD-10-CM | POA: Insufficient documentation

## 2015-01-28 DIAGNOSIS — R296 Repeated falls: Secondary | ICD-10-CM | POA: Diagnosis not present

## 2015-01-28 DIAGNOSIS — R609 Edema, unspecified: Secondary | ICD-10-CM | POA: Diagnosis not present

## 2015-01-28 DIAGNOSIS — T796XXD Traumatic ischemia of muscle, subsequent encounter: Secondary | ICD-10-CM

## 2015-01-28 DIAGNOSIS — E860 Dehydration: Secondary | ICD-10-CM

## 2015-01-28 DIAGNOSIS — H409 Unspecified glaucoma: Secondary | ICD-10-CM | POA: Insufficient documentation

## 2015-01-28 DIAGNOSIS — M542 Cervicalgia: Secondary | ICD-10-CM

## 2015-01-28 DIAGNOSIS — I10 Essential (primary) hypertension: Secondary | ICD-10-CM

## 2015-01-28 NOTE — Progress Notes (Signed)
Patient ID: Jack Zuniga, male   DOB: April 11, 1920, 81 y.o.   MRN: [id number]    HISTORY AND PHYSICAL  Location:  [facility] Room Number: 807 Place of Service: SNF [phone])   Extended Emergency Contact Information Primary Emergency Contact: Richardson,Carolyn Address: [address]          [place], [place] [address] Johnnette Litter of [place] Phone: [phone] Relation: Daughter Secondary Emergency Contact: Jacqualine Code, [place] of [place] Phone: [phone] Mobile Phone: [phone] Relation: Daughter  Advanced Directive information Does patient have an advance directive?: No, Would patient like information on creating an advanced directive?: No - patient declined information  Chief Complaint  Patient presents with  . New Admit To SNF    Following hospitalization after a fall with rhabdomyolysis    HPI:  Admitted to [facility] 01/20/2015 following hospitalization from 01/15/2015 through 01/20/2015. She was brought to the hospital following at least an 8 hour stay on the floor because he could not get up after a fall. Rhabdomyolysis was present as well as mild dehydration. Urinary tract infection with Morganella morganii was found and he was in acute pulmonary edema. Blood pressure was quite high on admission but was brought under control.  Patient was felt to have protein calorie malnutrition. He had a CVA in 2011. There is chronic constipation and chronic neck pain. There is mild memory loss according to the patient.  Patient says that the swelling noted today on his ankles is new. He was started on a higher dose of amlodipine while hospitalized.  This recent fall with rhabdomyolysis his left him quite debilitated. He lives alone. Due to his generalized weakness is not appropriate to be left alone yet.  We anticipate he will be here for short-term rehabilitation stay.  Past Medical  History  Diagnosis Date  . Hypertension   . Stroke [facility])     2011 in [place], no deficits    No past surgical history on file.  Patient Care Team: Helane Rima, MD as PCP - General (Family Medicine)  Social History   Social History  . Marital Status: Widowed    Spouse Name: N/A  . Number of Children: N/A  . Years of Education: N/A   Occupational History  . Not on file.   Social History Main Topics  . Smoking status: Former Smoker    Types: Cigars  . Smokeless tobacco: Never Used  . Alcohol Use: No  . Drug Use: No  . Sexual Activity: Not on file   Other Topics Concern  . Not on file   Social History Narrative    reports that he has quit smoking. His smoking use included Cigars. He has never used smokeless tobacco. He reports that he does not drink alcohol or use illicit drugs.  Family History  Problem Relation Age of Onset  . Stroke Neg Hx   . Kidney disease Neg Hx    No family status information on file.     There is no immunization history on file for this patient.  No Known Allergies  Medications: Patient's Medications  New Prescriptions   No medications on file  Previous Medications   ACETAMINOPHEN (TYLENOL) 325 MG TABLET    Take 650 mg by mouth every 6 (six) hours as needed (for pain.).   AMLODIPINE (NORVASC) 10 MG TABLET    Take 1 tablet (10 mg total)  by mouth daily.   COMBIGAN 0.2-0.5 % OPHTHALMIC SOLUTION    Place 1 drop into both eyes 2 (two) times daily.   DOCUSATE SODIUM (COLACE) 100 MG CAPSULE    Take 1 capsule (100 mg total) by mouth 2 (two) times daily.   DORZOLAMIDE (TRUSOPT) 2 % OPHTHALMIC SOLUTION    Place 1 drop into both eyes 3 (three) times daily.   FEEDING SUPPLEMENT, ENSURE ENLIVE, (ENSURE ENLIVE) LIQD    Take 237 mLs by mouth every morning.   MULTIPLE VITAMINS-MINERALS (ONE-A-DAY MENS 50+ ADVANTAGE) TABS    Take 1 tablet by mouth daily.   POLYETHYLENE GLYCOL (MIRALAX / GLYCOLAX) PACKET    Take 17 g by mouth daily.  Modified  Medications   No medications on file  Discontinued Medications   No medications on file    Review of Systems  Constitutional: Negative for fever, activity change, appetite change, fatigue and unexpected weight change.       Generalized weakness and dependent on staff for many activities of daily living.  HENT: Positive for hearing loss. Negative for congestion, ear pain, rhinorrhea, sore throat, tinnitus, trouble swallowing and voice change.   Eyes:       Corrective lenses  Respiratory: Negative for cough, choking, chest tightness, shortness of breath and wheezing.   Cardiovascular: Positive for leg swelling. Negative for chest pain and palpitations.  Gastrointestinal: Negative for nausea, abdominal pain, diarrhea, constipation and abdominal distention.  Endocrine: Negative for cold intolerance, heat intolerance, polydipsia, polyphagia and polyuria.  Genitourinary: Negative for dysuria, urgency, frequency and testicular pain.       Not incontinent  Musculoskeletal: Positive for neck pain. Negative for myalgias, back pain, arthralgias and gait problem.       Osteoarthritis of the hands  Skin: Negative for color change, pallor and rash.       Healing abrasions at the right elbow  Allergic/Immunologic: Negative.   Neurological: Negative for dizziness, tremors, syncope, speech difficulty, weakness, numbness and headaches.  Hematological: Negative for adenopathy. Does not bruise/bleed easily.  Psychiatric/Behavioral: Negative for hallucinations, behavioral problems, confusion, sleep disturbance and decreased concentration. The patient is not nervous/anxious.     Filed Vitals:   01/28/15 1208  BP: 140/85  Pulse: 80  Temp: 98.6 F (37 C)  Resp: 16  Height: 5' 11" (1.803 m)  Weight: 200 lb (90.719 kg)   Body mass index is 27.91 kg/(m^2).  Physical Exam  Constitutional: He is oriented to person, place, and time. He appears well-developed and well-nourished. No distress.  HENT:    Right Ear: External ear normal.  Left Ear: External ear normal.  Nose: Nose normal.  Mouth/Throat: Oropharynx is clear and moist. No oropharyngeal exudate.  Mild hearing loss  Eyes: Conjunctivae and EOM are normal. Pupils are equal, round, and reactive to light.  Neck: No JVD present. No tracheal deviation present. No thyromegaly present.  Cardiovascular: Normal rate, regular rhythm, normal heart sounds and intact distal pulses.  Exam reveals no gallop and no friction rub.   No murmur heard. Pulmonary/Chest: No respiratory distress. He has no wheezes. He has rales. He exhibits no tenderness.  Abdominal: He exhibits no distension and no mass. There is no tenderness.  Musculoskeletal: Normal range of motion. He exhibits no edema (2+ bipedal) or tenderness.  Generalized weakness  Lymphadenopathy:    He has no cervical adenopathy.  Neurological: He is alert and oriented to person, place, and time. He has normal reflexes. No cranial nerve deficit. Coordination normal.  Skin: No   rash noted. No erythema. No pallor.  Psychiatric: He has a normal mood and affect. His behavior is normal. Judgment and thought content normal.    Labs reviewed: Lab Summary Latest Ref Rng 01/20/2015 01/19/2015 01/18/2015 01/17/2015  Hemoglobin 13.0-17.0 g/dL (None) (None) (None) (None)  Hematocrit 39.0-52.0 % (None) (None) (None) (None)  White count - (None) (None) (None) (None)  Platelet count - (None) (None) (None) (None)  Sodium 135 - 145 mmol/L 145 143 144 144  Potassium 3.5 - 5.1 mmol/L 3.8 3.3(L) 3.6 3.6  Calcium 8.9 - 10.3 mg/dL 9.4 8.9 8.7(L) 8.8(L)  Phosphorus - (None) (None) (None) (None)  Creatinine 0.61 - 1.24 mg/dL 0.87 0.93 0.90 1.02  AST 15 - 41 U/L (None) (None) 116(H) 118(H)  Alk Phos 38 - 126 U/L (None) (None) 38 35(L)  Bilirubin 0.3 - 1.2 mg/dL (None) (None) 0.7 0.7  Glucose 65 - 99 mg/dL 178(H) 135(H) 121(H) 95  Cholesterol - (None) (None) (None) (None)  HDL cholesterol - (None) (None) (None)  (None)  Triglycerides - (None) (None) (None) (None)  LDL Direct - (None) (None) (None) (None)  LDL Calc - (None) (None) (None) (None)  Total protein 6.5 - 8.1 g/dL (None) (None) 6.9 7.1  Albumin 3.5 - 5.0 g/dL (None) (None) 2.6(L) 2.7(L)   Lab Results  Component Value Date   BUN 23* 01/20/2015   No results found for: HGBA1C No results found for: TSH, T3TOTAL, T4TOTAL, THYROIDAB        Dg Chest 2 View  01/16/2015  CLINICAL DATA:  Shortness of breath, hypertension. Fall, found down on floor by family member. EXAM: CHEST  2 VIEW COMPARISON:  None. FINDINGS: Cardiac silhouette is mildly enlarged, mediastinal silhouette is nonsuspicious. Mildly elevated RIGHT hemidiaphragm. Chronic appearing interstitial changes without pleural effusion or focal consolidation. Probable calcified RIGHT hilar lymph nodes. Small nodular densities seen on the lateral RIGHT radiographs suggest granulomas or pulmonary vessel en face. LEFT apical pleural thickening. No pneumothorax. High-riding RIGHT humeral head compatible with old rotator cuff injury. Soft tissue planes are nonsuspicious. IMPRESSION: Borderline cardiomegaly.  No acute pulmonary process. Mild chronic appearing interstitial changes. Probable granulomatous disease. Electronically Signed   By: Courtnay  Bloomer M.D.   On: 01/16/2015 02:09   Ct Head Wo Contrast  01/10/2015  CLINICAL DATA:  Patient status post fall. Posterior neck pain. No reported loss of consciousness. EXAM: CT HEAD WITHOUT CONTRAST CT CERVICAL SPINE WITHOUT CONTRAST TECHNIQUE: Multidetector CT imaging of the head and cervical spine was performed following the standard protocol without intravenous contrast. Multiplanar CT image reconstructions of the cervical spine were also generated. COMPARISON:  None. FINDINGS: CT HEAD FINDINGS Ventricles and sulci are prominent compatible with atrophy. Extensive periventricular and subcortical white matter hypodensity compatible with chronic small  vessel ischemic changes. Encephalomalacia within the left occipital lobe. Paranasal sinuses are unremarkable. Mastoid air cells are unremarkable. The calvarium is intact. No evidence for intracranial hemorrhage or mass effect. CT CERVICAL SPINE FINDINGS There is focal kyphosis of the cervical spine at the C5 level with fusion of the C4-5 and C5-6 levels. Multilevel facet degenerative changes. Craniocervical junction is intact. No evidence for acute cervical spine fracture. Degenerative changes the posterior spinous processes involving the T1 level. IMPRESSION: Left occipital lobe encephalomalacia, favored represent subacute to chronic infarct. No acute intracranial hemorrhage. No acute cervical spine fracture. Multilevel degenerative changes and associated fusion of the cervical spine. Electronically Signed   By: Drew  Davis M.D.   On: 01/10/2015 15:44   Ct Cervical Spine Wo   Contrast  01/10/2015  CLINICAL DATA:  Patient status post fall. Posterior neck pain. No reported loss of consciousness. EXAM: CT HEAD WITHOUT CONTRAST CT CERVICAL SPINE WITHOUT CONTRAST TECHNIQUE: Multidetector CT imaging of the head and cervical spine was performed following the standard protocol without intravenous contrast. Multiplanar CT image reconstructions of the cervical spine were also generated. COMPARISON:  None. FINDINGS: CT HEAD FINDINGS Ventricles and sulci are prominent compatible with atrophy. Extensive periventricular and subcortical white matter hypodensity compatible with chronic small vessel ischemic changes. Encephalomalacia within the left occipital lobe. Paranasal sinuses are unremarkable. Mastoid air cells are unremarkable. The calvarium is intact. No evidence for intracranial hemorrhage or mass effect. CT CERVICAL SPINE FINDINGS There is focal kyphosis of the cervical spine at the C5 level with fusion of the C4-5 and C5-6 levels. Multilevel facet degenerative changes. Craniocervical junction is intact. No evidence for  acute cervical spine fracture. Degenerative changes the posterior spinous processes involving the T1 level. IMPRESSION: Left occipital lobe encephalomalacia, favored represent subacute to chronic infarct. No acute intracranial hemorrhage. No acute cervical spine fracture. Multilevel degenerative changes and associated fusion of the cervical spine. Electronically Signed   By: Lovey Newcomer M.D.   On: 01/10/2015 15:44   Ct Abdomen Pelvis W Contrast  01/17/2015  CLINICAL DATA:  Abdominal distention, evaluate for obstruction EXAM: CT ABDOMEN AND PELVIS WITH CONTRAST TECHNIQUE: Multidetector CT imaging of the abdomen and pelvis was performed using the standard protocol following bolus administration of intravenous contrast. CONTRAST:  97m OMNIPAQUE IOHEXOL 300 MG/ML SOLN, 1046mOMNIPAQUE IOHEXOL 300 MG/ML SOLN COMPARISON:  01/17/2015 FINDINGS: Lower chest: Trace right pleural effusion. Bilateral mild lower lobe atelectasis. Hepatobiliary: Negative Pancreas: Negative Spleen: Negative Adrenals/Urinary Tract: 7 mm low-attenuation lesion lower pole left kidney average attenuation value of -23. There is motion artifact. 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 No hydronephrosis on either side. Bladder is negative. Stomach/Bowel: Diffuse large bowel dilatation. No evidence of twisting to suggest volvulus. Furthermore there is no abrupt caliber transition to suggest volvulus. Distal sigmoid and rectum measure 7.2 cm. Although there is little gas in the rectum, there is fluid in the mid to distal sigmoid and rectum. No evidence of obstruction. There are air-fluid levels throughout the large bowel, which is distended up to a diameter of 8.5 cm in the transverse colon. Vascular/Lymphatic: No pathologic adenopathy. No evidence of aortic aneurysm. Reproductive: Prostate enlarged at 7 cm Other: Trace free fluid in the right upper quadrant Musculoskeletal: Scoliosis of the thoracolumbar spine with associated degenerative changes. No  acute findings. IMPRESSION: No evidence of sigmoid volvulus. Findings appear most consistent with colonic ileus. Significant enlargement of the prostate.  Correlate with PSA levels. Electronically Signed   By: RaSkipper Cliche.D.   On: 01/17/2015 17:44   Dg Chest Port 1 View  01/19/2015  CLINICAL DATA:  Acute onset of shortness of breath and wheezing. Initial encounter. EXAM: PORTABLE CHEST 1 VIEW COMPARISON:  Chest radiograph performed 01/16/2015 FINDINGS: The lungs are hypoexpanded. Vascular congestion is noted, with increased interstitial markings, raising question for mild interstitial edema. A small right pleural effusion is suspected. No pneumothorax is seen. The cardiomediastinal silhouette is borderline enlarged. No acute osseous abnormalities are seen. IMPRESSION: Lungs hypoexpanded. Vascular congestion and borderline cardiomegaly, with increased interstitial markings, raising question for mild interstitial edema. Suspect small right pleural effusion. Electronically Signed   By: JeGarald Balding.D.   On: 01/19/2015 01:53   Dg Abd Portable 1v  01/17/2015  CLINICAL DATA:  Abdominal distension. Right  lower quadrant pain for 1 day. EXAM: PORTABLE ABDOMEN - 1 VIEW COMPARISON:  None. FINDINGS: Single supine view of the abdomen and pelvis. Diffuse gaseous distention of bowel loops. Primarily colonic. Upper abdomen is excluded. No rectal gas identified. Prominent sigmoid gaseous distension. No pneumatosis. IMPRESSION: Diffuse gaseous distention of bowel loops, primarily colonic. Absence of rectal gas, with prominent gas-filled sigmoid colon. Findings could represent colonic ileus or distal obstruction. Sigmoid volvulus could have this appearance. Please note that the upper abdomen is excluded. Consider further evaluation with dedicated complete abdomen series versus CT. Electronically Signed   By: Kyle  Talbot M.D.   On: 01/17/2015 10:16     Assessment/Plan  1. Frequent falls Engaged in physical  therapy for strengthening and improvement in balance as well as self-care skills   2. Neck pain Chronic discomfort. Will use mild analgesics such as Tylenol.  3. Traumatic rhabdomyolysis, subsequent encounter Result  4. Essential hypertension Current problems of anemia likely brought on by use of amlodipine. I changed this to losartan 50 mg daily.  5. Dehydration Resolved  6. Edema  Discontinued amlodipine. Started losartan 50 mg daily.   

## 2015-02-05 ENCOUNTER — Encounter: Payer: Self-pay | Admitting: Adult Health

## 2015-02-05 ENCOUNTER — Non-Acute Institutional Stay (SKILLED_NURSING_FACILITY): Payer: Medicare PPO | Admitting: Adult Health

## 2015-02-05 DIAGNOSIS — E876 Hypokalemia: Secondary | ICD-10-CM

## 2015-02-05 DIAGNOSIS — K5900 Constipation, unspecified: Secondary | ICD-10-CM | POA: Diagnosis not present

## 2015-02-05 DIAGNOSIS — I10 Essential (primary) hypertension: Secondary | ICD-10-CM | POA: Diagnosis not present

## 2015-02-05 DIAGNOSIS — T796XXD Traumatic ischemia of muscle, subsequent encounter: Secondary | ICD-10-CM

## 2015-02-05 DIAGNOSIS — E46 Unspecified protein-calorie malnutrition: Secondary | ICD-10-CM | POA: Diagnosis not present

## 2015-02-05 DIAGNOSIS — D509 Iron deficiency anemia, unspecified: Secondary | ICD-10-CM

## 2015-02-05 DIAGNOSIS — R531 Weakness: Secondary | ICD-10-CM

## 2015-02-07 NOTE — Progress Notes (Signed)
Patient ID: Jack Zuniga, male   DOB: [birth year], 81 y.o.   MRN: [id number]    DATE: 01/21/15  MRN:  [id number]  BIRTHDAY: May 23, 1919  Facility:  Nursing Home Location:  [facility]  Nursing Home Room Number: 807-P  LEVEL OF CARE:  SNF (31)  Contact Information    Name Relation Home Work Mobile   Richardson,Carolyn Daughter [phone]     Florene Glen Daughter [phone]  [phone]       Chief Complaint  Patient presents with  . Hospitalization Follow-up    Generalized weakness, rhabdomyolysis, UTI, hypertension, protein calorie malnutrition and constipation    HISTORY OF PRESENT ILLNESS:  This is a 81 year old male who has been admitted to [facility] on 01/20/15 from [facility]. He has PMH of CVA in 2011 and hypertension. He had a fall at home and was lying on the floor for over 8 hours. He was found to have lactic acidosis with markedly elevated CPK. A week prior to this event, he fell at home, hospitalized and was found to have UTI and discharged on Keflex. IV fluids hydration was done and given 1 dose of fosfomycin prior to discharge during this hospitalization.  He has been admitted for a short-term rehabilitation.  PAST MEDICAL HISTORY:  Past Medical History  Diagnosis Date  . Hypertension   . Stroke (HCC)     2011 in [place], no deficits     CURRENT MEDICATIONS: Reviewed  Patient's Medications  New Prescriptions   No medications on file  Previous Medications   ACETAMINOPHEN (TYLENOL) 325 MG TABLET    Take 650 mg by mouth every 6 (six) hours as needed (for pain.).   AMLODIPINE (NORVASC) 10 MG TABLET    Take 1 tablet (10 mg total) by mouth daily.   COMBIGAN 0.2-0.5 % OPHTHALMIC SOLUTION    Place 1 drop into both eyes 2 (two) times daily.   DOCUSATE SODIUM (COLACE) 100 MG CAPSULE    Take 1 capsule (100 mg total) by mouth 2 (two) times daily.   DORZOLAMIDE (TRUSOPT) 2 % OPHTHALMIC SOLUTION    Place 1 drop into both eyes 3 (three) times  daily.   FEEDING SUPPLEMENT, ENSURE ENLIVE, (ENSURE ENLIVE) LIQD    Take 237 mLs by mouth every morning.   MULTIPLE VITAMINS-MINERALS (ONE-A-DAY MENS 50+ ADVANTAGE) TABS    Take 1 tablet by mouth daily.   POLYETHYLENE GLYCOL (MIRALAX / GLYCOLAX) PACKET    Take 17 g by mouth daily.  Modified Medications   No medications on file  Discontinued Medications   No medications on file     No Known Allergies   REVIEW OF SYSTEMS:  GENERAL: no change in appetite, no fatigue, no weight changes, no fever, chills or weakness EYES: Denies change in vision, dry eyes, eye pain, itching or discharge EARS: Denies change in hearing, ringing in ears, or earache NOSE: Denies nasal congestion or epistaxis MOUTH and THROAT: Denies oral discomfort, gingival pain or bleeding, pain from teeth or hoarseness   RESPIRATORY: no cough, SOB, DOE, wheezing, hemoptysis CARDIAC: no chest pain, edema or palpitations GI: no abdominal pain, diarrhea, constipation, heart burn, nausea or vomiting GU: Denies dysuria, frequency, hematuria, incontinence, or discharge PSYCHIATRIC: Denies feeling of depression or anxiety. No report of hallucinations, insomnia, paranoia, or agitation   PHYSICAL EXAMINATION  GENERAL APPEARANCE: Well nourished. In no acute distress. Normal body habitus HEAD: Normal in size and contour. No evidence of trauma EYES: Lids open and close normally. No blepharitis, entropion  or ectropion. PERRL. Conjunctivae are clear and sclerae are white. Lenses are without opacity EARS: Pinnae are normal. Patient hears normal voice tunes of the examiner MOUTH and THROAT: Lips are without lesions. Oral mucosa is moist and without lesions. Tongue is normal in shape, size, and color and without lesions NECK: supple, trachea midline, no neck masses, no thyroid tenderness, no thyromegaly LYMPHATICS: no LAN in the neck, no supraclavicular LAN RESPIRATORY: breathing is even & unlabored, BS CTAB CARDIAC: RRR, no murmur,no  extra heart sounds, no edema GI: abdomen soft, normal BS, no masses, no tenderness, no hepatomegaly, no splenomegaly EXTREMITIES:  Able to move 4 extremities PSYCHIATRIC: Alert and oriented X 3. Affect and behavior are appropriate  LABS/RADIOLOGY: Labs reviewed: Basic Metabolic Panel:  Recent Labs  16/10/96 0517 01/19/15 0300 01/20/15 0531  NA 144 143 145  K 3.6 3.3* 3.8  CL 114* 111 113*  CO2 19* 24 25  GLUCOSE 121* 135* 178*  BUN 23* 23* 23*  CREATININE 0.90 0.93 0.87  CALCIUM 8.7* 8.9 9.4   Liver Function Tests:  Recent Labs  01/16/15 0053 01/17/15 0718 01/18/15 0517  AST 74* 118* 116*  ALT 29 42 46  ALKPHOS 42 35* 38  BILITOT 0.5 0.7 0.7  PROT 8.7* 7.1 6.9  ALBUMIN 3.6 2.7* 2.6*   CBC:  Recent Labs  01/10/15 1502 01/16/15 0053 01/16/15 0534  WBC 4.0 7.8 5.7  NEUTROABS 2.2 6.5  --   HGB 11.5* 11.3* 10.8*  HCT 35.6* 34.3* 32.8*  MCV 72.5* 73.6* 73.2*  PLT 190 187 198   Cardiac Enzymes:  Recent Labs  01/18/15 0517 01/19/15 0300 01/20/15 0531  CKTOTAL 2599* 2555* 748*     Dg Chest 2 View  01/16/2015  CLINICAL DATA:  Shortness of breath, hypertension. Fall, found down on floor by family member. EXAM: CHEST  2 VIEW COMPARISON:  None. FINDINGS: Cardiac silhouette is mildly enlarged, mediastinal silhouette is nonsuspicious. Mildly elevated RIGHT hemidiaphragm. Chronic appearing interstitial changes without pleural effusion or focal consolidation. Probable calcified RIGHT hilar lymph nodes. Small nodular densities seen on the lateral RIGHT radiographs suggest granulomas or pulmonary vessel en face. LEFT apical pleural thickening. No pneumothorax. High-riding RIGHT humeral head compatible with old rotator cuff injury. Soft tissue planes are nonsuspicious. IMPRESSION: Borderline cardiomegaly.  No acute pulmonary process. Mild chronic appearing interstitial changes. Probable granulomatous disease. Electronically Signed   By: Awilda Metro M.D.   On:  01/16/2015 02:09   Ct Head Wo Contrast  01/10/2015  CLINICAL DATA:  Patient status post fall. Posterior neck pain. No reported loss of consciousness. EXAM: CT HEAD WITHOUT CONTRAST CT CERVICAL SPINE WITHOUT CONTRAST TECHNIQUE: Multidetector CT imaging of the head and cervical spine was performed following the standard protocol without intravenous contrast. Multiplanar CT image reconstructions of the cervical spine were also generated. COMPARISON:  None. FINDINGS: CT HEAD FINDINGS Ventricles and sulci are prominent compatible with atrophy. Extensive periventricular and subcortical white matter hypodensity compatible with chronic small vessel ischemic changes. Encephalomalacia within the left occipital lobe. Paranasal sinuses are unremarkable. Mastoid air cells are unremarkable. The calvarium is intact. No evidence for intracranial hemorrhage or mass effect. CT CERVICAL SPINE FINDINGS There is focal kyphosis of the cervical spine at the C5 level with fusion of the C4-5 and C5-6 levels. Multilevel facet degenerative changes. Craniocervical junction is intact. No evidence for acute cervical spine fracture. Degenerative changes the posterior spinous processes involving the T1 level. IMPRESSION: Left occipital lobe encephalomalacia, favored represent subacute to chronic infarct. No  acute intracranial hemorrhage. No acute cervical spine fracture. Multilevel degenerative changes and associated fusion of the cervical spine. Electronically Signed   By: Annia Belt M.D.   On: 01/10/2015 15:44   Ct Cervical Spine Wo Contrast  01/10/2015  CLINICAL DATA:  Patient status post fall. Posterior neck pain. No reported loss of consciousness. EXAM: CT HEAD WITHOUT CONTRAST CT CERVICAL SPINE WITHOUT CONTRAST TECHNIQUE: Multidetector CT imaging of the head and cervical spine was performed following the standard protocol without intravenous contrast. Multiplanar CT image reconstructions of the cervical spine were also generated.  COMPARISON:  None. FINDINGS: CT HEAD FINDINGS Ventricles and sulci are prominent compatible with atrophy. Extensive periventricular and subcortical white matter hypodensity compatible with chronic small vessel ischemic changes. Encephalomalacia within the left occipital lobe. Paranasal sinuses are unremarkable. Mastoid air cells are unremarkable. The calvarium is intact. No evidence for intracranial hemorrhage or mass effect. CT CERVICAL SPINE FINDINGS There is focal kyphosis of the cervical spine at the C5 level with fusion of the C4-5 and C5-6 levels. Multilevel facet degenerative changes. Craniocervical junction is intact. No evidence for acute cervical spine fracture. Degenerative changes the posterior spinous processes involving the T1 level. IMPRESSION: Left occipital lobe encephalomalacia, favored represent subacute to chronic infarct. No acute intracranial hemorrhage. No acute cervical spine fracture. Multilevel degenerative changes and associated fusion of the cervical spine. Electronically Signed   By: Annia Belt M.D.   On: 01/10/2015 15:44   Ct Abdomen Pelvis W Contrast  01/17/2015  CLINICAL DATA:  Abdominal distention, evaluate for obstruction EXAM: CT ABDOMEN AND PELVIS WITH CONTRAST TECHNIQUE: Multidetector CT imaging of the abdomen and pelvis was performed using the standard protocol following bolus administration of intravenous contrast. CONTRAST:  36mL OMNIPAQUE IOHEXOL 300 MG/ML SOLN, OMNIPAQUE IOHEXOL 300 MG/ML SOLN COMPARISON:  01/17/2015 FINDINGS: Lower chest: Trace right pleural effusion. Bilateral mild lower lobe atelectasis. Hepatobiliary: Negative Pancreas: Negative Spleen: Negative Adrenals/Urinary Tract: 7 mm low-attenuation lesion lower pole left kidney average attenuation value of -23. There is motion artifact. 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 No hydronephrosis on either side. Bladder is negative. Stomach/Bowel: Diffuse large bowel dilatation. No evidence of twisting to  suggest volvulus. Furthermore there is no abrupt caliber transition to suggest volvulus. Distal sigmoid and rectum measure 7.2 cm. Although there is little gas in the rectum, there is fluid in the mid to distal sigmoid and rectum. No evidence of obstruction. There are air-fluid levels throughout the large bowel, which is distended up to a diameter of 8.5 cm in the transverse colon. Vascular/Lymphatic: No pathologic adenopathy. No evidence of aortic aneurysm. Reproductive: Prostate enlarged at 7 cm Other: Trace free fluid in the right upper quadrant Musculoskeletal: Scoliosis of the thoracolumbar spine with associated degenerative changes. No acute findings. IMPRESSION: No evidence of sigmoid volvulus. Findings appear most consistent with colonic ileus. Significant enlargement of the prostate.  Correlate with PSA levels. Electronically Signed   By: Esperanza Heir M.D.   On: 01/17/2015 17:44   Dg Chest Port 1 View  01/19/2015  CLINICAL DATA:  Acute onset of shortness of breath and wheezing. Initial encounter. EXAM: PORTABLE CHEST 1 VIEW COMPARISON:  Chest radiograph performed 01/16/2015 FINDINGS: The lungs are hypoexpanded. Vascular congestion is noted, with increased interstitial markings, raising question for mild interstitial edema. A small right pleural effusion is suspected. No pneumothorax is seen. The cardiomediastinal silhouette is borderline enlarged. No acute osseous abnormalities are seen. IMPRESSION: Lungs hypoexpanded. Vascular congestion and borderline cardiomegaly, with increased interstitial markings, raising question  for mild interstitial edema. Suspect small right pleural effusion. Electronically Signed   By: Roanna Raider M.D.   On: 01/19/2015 01:53   Dg Abd Portable 1v  01/17/2015  CLINICAL DATA:  Abdominal distension. Right lower quadrant pain for 1 day. EXAM: PORTABLE ABDOMEN - 1 VIEW COMPARISON:  None. FINDINGS: Single supine view of the abdomen and pelvis. Diffuse gaseous distention of  bowel loops. Primarily colonic. Upper abdomen is excluded. No rectal gas identified. Prominent sigmoid gaseous distension. No pneumatosis. IMPRESSION: Diffuse gaseous distention of bowel loops, primarily colonic. Absence of rectal gas, with prominent gas-filled sigmoid colon. Findings could represent colonic ileus or distal obstruction. Sigmoid volvulus could have this appearance. Please note that the upper abdomen is excluded. Consider further evaluation with dedicated complete abdomen series versus CT. Electronically Signed   By: Jeronimo Greaves M.D.   On: 01/17/2015 10:16    ASSESSMENT/PLAN:  Generalized weakness - for rehabilitation  Rhabdomyolysis secondary to fall - CPK slowly trending down, CK 748  UTI - finished Keflex and given 1 dose of fosfomycin in the hospital  Hypertension - increase Norvasc to 10 mg 1 tab by mouth daily; check BMP in 1 week; DP BAP 1 week  Iron deficiency anemia - hemoglobin 10.8; CBC in 1 week  Protein calorie malnutrition, severe - albumin 2.6; RD consult  Constipation - continue Colace 100 mg by mouth twice a day and MiraLAX 17 g by mouth daily; BMP in 1 week     Goals of care:  Short-term rehabilitation     [facility], NP [facility] [phone]

## 2015-02-07 NOTE — Progress Notes (Signed)
Patient ID: Jack Zuniga, male   DOB: 1919/05/06, 81 y.o.   MRN: [id number]    DATE: 02/05/15  MRN:  [id number]  BIRTHDAY: 01/07/1920  Facility:  Nursing Home Location:  [facility]  Nursing Home Room Number: 807-P  LEVEL OF CARE:  SNF (31)  Contact Information    Name Relation Home Work Mobile   Richardson,Carolyn Daughter [phone]     Florene Glen Daughter [phone]  [phone]       Chief Complaint  Patient presents with  . Discharge Note    Generalized weakness, rhabdomyolysis, hypokalemia, hypertension, protein calorie malnutrition and constipation    HISTORY OF PRESENT ILLNESS:  This is a 81 year old male who is for discharge home with home health PT for endurance, OT for ADLs and CNA for showers. DME:  31 bedside commode and rolling walker. He has been admitted to [facility] on 01/20/15 from [facility]. He has PMH of CVA in 2011 and hypertension. He had a fall at home and was lying on the floor for over 8 hours. He was found to have lactic acidosis with markedly elevated CPK. A week prior to this event, he fell at home, hospitalized and was found to have UTI and discharged on Keflex. IV fluids hydration was done and given 1 dose of fosfomycin prior to discharge during this hospitalization.  During his rehabilitation, his K 2.7 and was supplemented. Re-checked K 3.4.  Patient was admitted to this facility for short-term rehabilitation after the patient's recent hospitalization.  Patient has completed SNF rehabilitation and therapy has cleared the patient for discharge.   PAST MEDICAL HISTORY:  Past Medical History  Diagnosis Date  . Hypertension   . Stroke [facility])     2011 in [place], no deficits     CURRENT MEDICATIONS: Reviewed     Medication List       This list is accurate as of: 02/05/15 11:59 PM.  Always use your most recent med list.               acetaminophen 325 MG tablet  Commonly known as:  TYLENOL  Take 650 mg by  mouth every 6 (six) hours as needed (for pain.).     COMBIGAN 0.2-0.5 % ophthalmic solution  Generic drug:  brimonidine-timolol  Place 1 drop into both eyes 2 (two) times daily.     docusate sodium 100 MG capsule  Commonly known as:  COLACE  Take 1 capsule (100 mg total) by mouth 2 (two) times daily.     dorzolamide 2 % ophthalmic solution  Commonly known as:  TRUSOPT  Place 1 drop into both eyes 3 (three) times daily.     feeding supplement (ENSURE ENLIVE) Liqd  Take 237 mLs by mouth every morning.     losartan 50 MG tablet  Commonly known as:  COZAAR  Take 50 mg by mouth daily.     ONE-A-DAY MENS 50+ ADVANTAGE Tabs  Take 1 tablet by mouth daily.     polyethylene glycol packet  Commonly known as:  MIRALAX / GLYCOLAX  Take 17 g by mouth daily.     PROCEL PO  Take 2 scoop by mouth 2 (two) times daily.          No Known Allergies   REVIEW OF SYSTEMS:  GENERAL: no change in appetite, no fatigue, no weight changes, no fever, chills or weakness EYES: Denies change in vision, dry eyes, eye pain, itching or discharge EARS: Denies change in hearing, ringing  in ears, or earache NOSE: Denies nasal congestion or epistaxis MOUTH and THROAT: Denies oral discomfort, gingival pain or bleeding, pain from teeth or hoarseness   RESPIRATORY: no cough, SOB, DOE, wheezing, hemoptysis CARDIAC: no chest pain or palpitations GI: no abdominal pain, diarrhea, constipation, heart burn, nausea or vomiting GU: Denies dysuria, frequency, hematuria, incontinence, or discharge PSYCHIATRIC: Denies feeling of depression or anxiety. No report of hallucinations, insomnia, paranoia, or agitation   PHYSICAL EXAMINATION  GENERAL APPEARANCE: Well nourished. In no acute distress. Normal body habitus HEAD: Normal in size and contour. No evidence of trauma EYES: Lids open and close normally. No blepharitis, entropion or ectropion. PERRL. Conjunctivae are clear and sclerae are white. Lenses are without  opacity EARS: Pinnae are normal. Patient hears normal voice tunes of the examiner MOUTH and THROAT: Lips are without lesions. Oral mucosa is moist and without lesions. Tongue is normal in shape, size, and color and without lesions NECK: supple, trachea midline, no neck masses, no thyroid tenderness, no thyromegaly LYMPHATICS: no LAN in the neck, no supraclavicular LAN RESPIRATORY: breathing is even & unlabored, BS CTAB CARDIAC: RRR, no murmur,no extra heart sounds, BLE edema 1+ GI: abdomen soft, normal BS, no masses, no tenderness, no hepatomegaly, no splenomegaly EXTREMITIES:  Able to move 4 extremities PSYCHIATRIC: Alert and oriented X 3. Affect and behavior are appropriate  LABS/RADIOLOGY: Labs reviewed: 1017/16  sodium 144 potassium 3.4 glucose 81 BUN 12 creatinine 0.92 calcium 8.8 01/27/15  surgery 144 potassium 3.7 glucose 84 BUN 10 creatinine 0.81 calcium 8.1 Basic Metabolic Panel:  Recent Labs  16/10/96 0517 01/19/15 0300 01/20/15 0531  NA 144 143 145  K 3.6 3.3* 3.8  CL 114* 111 113*  CO2 19* 24 25  GLUCOSE 121* 135* 178*  BUN 23* 23* 23*  CREATININE 0.90 0.93 0.87  CALCIUM 8.7* 8.9 9.4   Liver Function Tests:  Recent Labs  01/16/15 0053 01/17/15 0718 01/18/15 0517  AST 74* 118* 116*  ALT 29 42 46  ALKPHOS 42 35* 38  BILITOT 0.5 0.7 0.7  PROT 8.7* 7.1 6.9  ALBUMIN 3.6 2.7* 2.6*   CBC:  Recent Labs  01/10/15 1502 01/16/15 0053 01/16/15 0534  WBC 4.0 7.8 5.7  NEUTROABS 2.2 6.5  --   HGB 11.5* 11.3* 10.8*  HCT 35.6* 34.3* 32.8*  MCV 72.5* 73.6* 73.2*  PLT 190 187 198   Cardiac Enzymes:  Recent Labs  01/18/15 0517 01/19/15 0300 01/20/15 0531  CKTOTAL 2599* 2555* 748*     Dg Chest 2 View  01/16/2015  CLINICAL DATA:  Shortness of breath, hypertension. Fall, found down on floor by family member. EXAM: CHEST  2 VIEW COMPARISON:  None. FINDINGS: Cardiac silhouette is mildly enlarged, mediastinal silhouette is nonsuspicious. Mildly elevated RIGHT  hemidiaphragm. Chronic appearing interstitial changes without pleural effusion or focal consolidation. Probable calcified RIGHT hilar lymph nodes. Small nodular densities seen on the lateral RIGHT radiographs suggest granulomas or pulmonary vessel en face. LEFT apical pleural thickening. No pneumothorax. High-riding RIGHT humeral head compatible with old rotator cuff injury. Soft tissue planes are nonsuspicious. IMPRESSION: Borderline cardiomegaly.  No acute pulmonary process. Mild chronic appearing interstitial changes. Probable granulomatous disease. Electronically Signed   By: Awilda Metro M.D.   On: 01/16/2015 02:09   Ct Head Wo Contrast  01/10/2015  CLINICAL DATA:  Patient status post fall. Posterior neck pain. No reported loss of consciousness. EXAM: CT HEAD WITHOUT CONTRAST CT CERVICAL SPINE WITHOUT CONTRAST TECHNIQUE: Multidetector CT imaging of the head and cervical  spine was performed following the standard protocol without intravenous contrast. Multiplanar CT image reconstructions of the cervical spine were also generated. COMPARISON:  None. FINDINGS: CT HEAD FINDINGS Ventricles and sulci are prominent compatible with atrophy. Extensive periventricular and subcortical white matter hypodensity compatible with chronic small vessel ischemic changes. Encephalomalacia within the left occipital lobe. Paranasal sinuses are unremarkable. Mastoid air cells are unremarkable. The calvarium is intact. No evidence for intracranial hemorrhage or mass effect. CT CERVICAL SPINE FINDINGS There is focal kyphosis of the cervical spine at the C5 level with fusion of the C4-5 and C5-6 levels. Multilevel facet degenerative changes. Craniocervical junction is intact. No evidence for acute cervical spine fracture. Degenerative changes the posterior spinous processes involving the T1 level. IMPRESSION: Left occipital lobe encephalomalacia, favored represent subacute to chronic infarct. No acute intracranial hemorrhage. No  acute cervical spine fracture. Multilevel degenerative changes and associated fusion of the cervical spine. Electronically Signed   By: Annia Belt M.D.   On: 01/10/2015 15:44   Ct Cervical Spine Wo Contrast  01/10/2015  CLINICAL DATA:  Patient status post fall. Posterior neck pain. No reported loss of consciousness. EXAM: CT HEAD WITHOUT CONTRAST CT CERVICAL SPINE WITHOUT CONTRAST TECHNIQUE: Multidetector CT imaging of the head and cervical spine was performed following the standard protocol without intravenous contrast. Multiplanar CT image reconstructions of the cervical spine were also generated. COMPARISON:  None. FINDINGS: CT HEAD FINDINGS Ventricles and sulci are prominent compatible with atrophy. Extensive periventricular and subcortical white matter hypodensity compatible with chronic small vessel ischemic changes. Encephalomalacia within the left occipital lobe. Paranasal sinuses are unremarkable. Mastoid air cells are unremarkable. The calvarium is intact. No evidence for intracranial hemorrhage or mass effect. CT CERVICAL SPINE FINDINGS There is focal kyphosis of the cervical spine at the C5 level with fusion of the C4-5 and C5-6 levels. Multilevel facet degenerative changes. Craniocervical junction is intact. No evidence for acute cervical spine fracture. Degenerative changes the posterior spinous processes involving the T1 level. IMPRESSION: Left occipital lobe encephalomalacia, favored represent subacute to chronic infarct. No acute intracranial hemorrhage. No acute cervical spine fracture. Multilevel degenerative changes and associated fusion of the cervical spine. Electronically Signed   By: Annia Belt M.D.   On: 01/10/2015 15:44   Ct Abdomen Pelvis W Contrast  01/17/2015  CLINICAL DATA:  Abdominal distention, evaluate for obstruction EXAM: CT ABDOMEN AND PELVIS WITH CONTRAST TECHNIQUE: Multidetector CT imaging of the abdomen and pelvis was performed using the standard protocol following  bolus administration of intravenous contrast. CONTRAST:  50mL OMNIPAQUE IOHEXOL 300 MG/ML SOLN, OMNIPAQUE IOHEXOL 300 MG/ML SOLN COMPARISON:  01/17/2015 FINDINGS: Lower chest: Trace right pleural effusion. Bilateral mild lower lobe atelectasis. Hepatobiliary: Negative Pancreas: Negative Spleen: Negative Adrenals/Urinary Tract: 7 mm low-attenuation lesion lower pole left kidney average attenuation value of -23. There is motion artifact. 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 No hydronephrosis on either side. Bladder is negative. Stomach/Bowel: Diffuse large bowel dilatation. No evidence of twisting to suggest volvulus. Furthermore there is no abrupt caliber transition to suggest volvulus. Distal sigmoid and rectum measure 7.2 cm. Although there is little gas in the rectum, there is fluid in the mid to distal sigmoid and rectum. No evidence of obstruction. There are air-fluid levels throughout the large bowel, which is distended up to a diameter of 8.5 cm in the transverse colon. Vascular/Lymphatic: No pathologic adenopathy. No evidence of aortic aneurysm. Reproductive: Prostate enlarged at 7 cm Other: Trace free fluid in the right upper quadrant Musculoskeletal: Scoliosis  of the thoracolumbar spine with associated degenerative changes. No acute findings. IMPRESSION: No evidence of sigmoid volvulus. Findings appear most consistent with colonic ileus. Significant enlargement of the prostate.  Correlate with PSA levels. Electronically Signed   By: Esperanza Heir M.D.   On: 01/17/2015 17:44   Dg Chest Port 1 View  01/19/2015  CLINICAL DATA:  Acute onset of shortness of breath and wheezing. Initial encounter. EXAM: PORTABLE CHEST 1 VIEW COMPARISON:  Chest radiograph performed 01/16/2015 FINDINGS: The lungs are hypoexpanded. Vascular congestion is noted, with increased interstitial markings, raising question for mild interstitial edema. A small right pleural effusion is suspected. No pneumothorax is seen. The  cardiomediastinal silhouette is borderline enlarged. No acute osseous abnormalities are seen. IMPRESSION: Lungs hypoexpanded. Vascular congestion and borderline cardiomegaly, with increased interstitial markings, raising question for mild interstitial edema. Suspect small right pleural effusion. Electronically Signed   By: Roanna Raider M.D.   On: 01/19/2015 01:53   Dg Abd Portable 1v  01/17/2015  CLINICAL DATA:  Abdominal distension. Right lower quadrant pain for 1 day. EXAM: PORTABLE ABDOMEN - 1 VIEW COMPARISON:  None. FINDINGS: Single supine view of the abdomen and pelvis. Diffuse gaseous distention of bowel loops. Primarily colonic. Upper abdomen is excluded. No rectal gas identified. Prominent sigmoid gaseous distension. No pneumatosis. IMPRESSION: Diffuse gaseous distention of bowel loops, primarily colonic. Absence of rectal gas, with prominent gas-filled sigmoid colon. Findings could represent colonic ileus or distal obstruction. Sigmoid volvulus could have this appearance. Please note that the upper abdomen is excluded. Consider further evaluation with dedicated complete abdomen series versus CT. Electronically Signed   By: Jeronimo Greaves M.D.   On: 01/17/2015 10:16    ASSESSMENT/PLAN:  Generalized weakness - for home health PT, OT and CNA   Rhabdomyolysis secondary to fall - resolved   UTI - resolved  Hypertension -  Well-controlled; Amlodipine was recently discontinued and was started on Losartan 50 mg PO daily  Iron deficiency anemia - hemoglobin 10.8; stable  Protein calorie malnutrition, severe - albumin 2.6; continue supplementation  Constipation - continue Colace 100 mg by mouth twice a day and MiraLAX 17 g by mouth daily  Hypokalemia - K 3.4; supplemented     I have filled out patient's discharge paperwork and written prescriptions.  Patient will receive home health PT, OT and CNA.  DME provided: 3 in 1 bedside commode and rolling walker  Total discharge time: Greater  than 30 minutes  Discharge time involved coordination of the discharge process with social worker, nursing staff and therapy department. Medical justification for home health services/DME verified.    [facility], NP [employer] [phone]

## 2015-04-05 ENCOUNTER — Other Ambulatory Visit: Payer: Self-pay | Admitting: Adult Health

## 2015-04-07 ENCOUNTER — Other Ambulatory Visit: Payer: Self-pay | Admitting: Adult Health

## 2017-04-14 IMAGING — CT CT ABD-PELV W/ CM
2 of 5 series · 17 of 46 positions shown, 19 images · IV contrast (OMNIPAQUE)
Comparison: 01/17/2015

CLINICAL DATA: Abdominal distention, evaluate for obstruction

EXAM:
CT ABDOMEN AND PELVIS WITH CONTRAST
TECHNIQUE: Multidetector CT imaging of the abdomen and pelvis was performed
using the standard protocol following bolus administration of
intravenous contrast.
CONTRAST:  50mL OMNIPAQUE IOHEXOL 300 MG/ML SOLN, 100mL OMNIPAQUE
IOHEXOL 300 MG/ML SOLN

[Series 2: rtn a/p with · axial · 0.82mm/px · z∈[+968,+1432]mm · 14 of 105 slices shown, 16 images]
[im 6/105  soft-tissue]
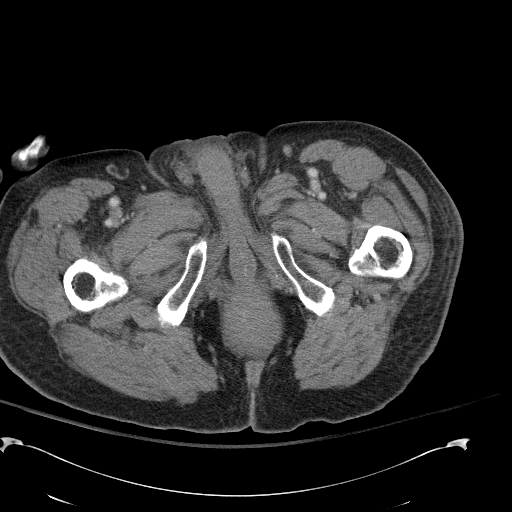
[im 6/105  bone]
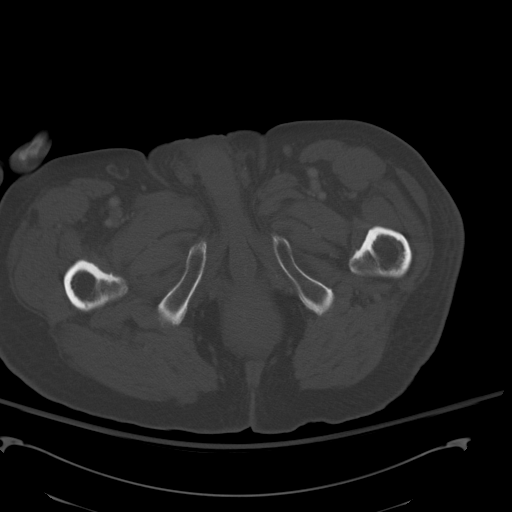
[im 16/105  soft-tissue]
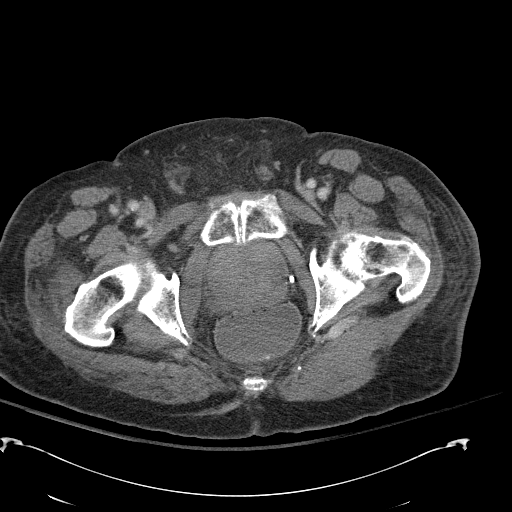
[im 21/105  soft-tissue]
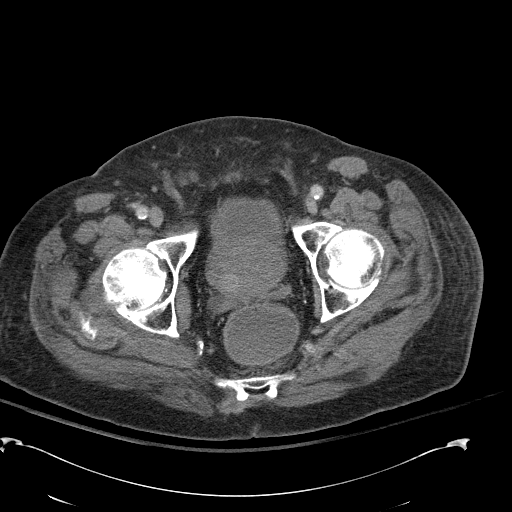
[im 27/105  soft-tissue]
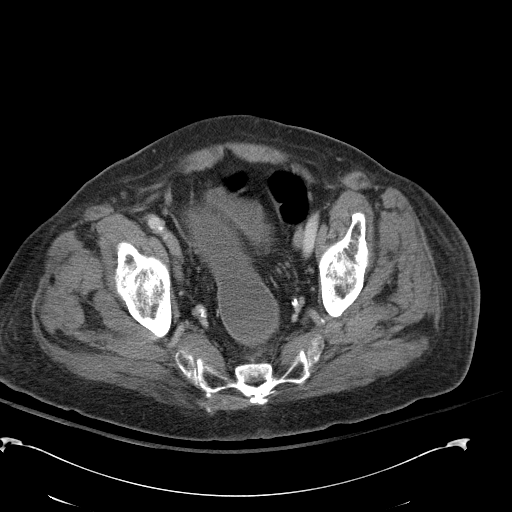
[im 37/105  soft-tissue]
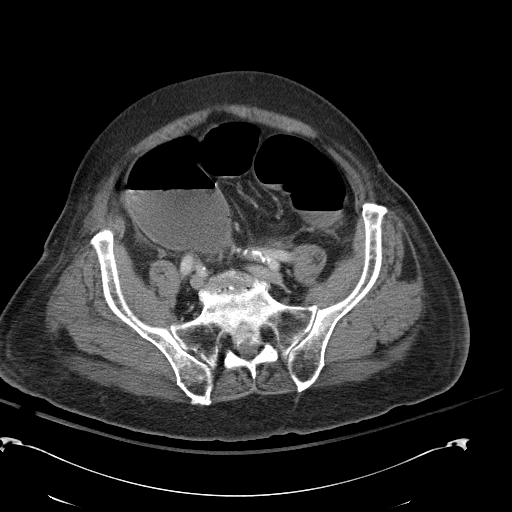
[im 42/105  soft-tissue]
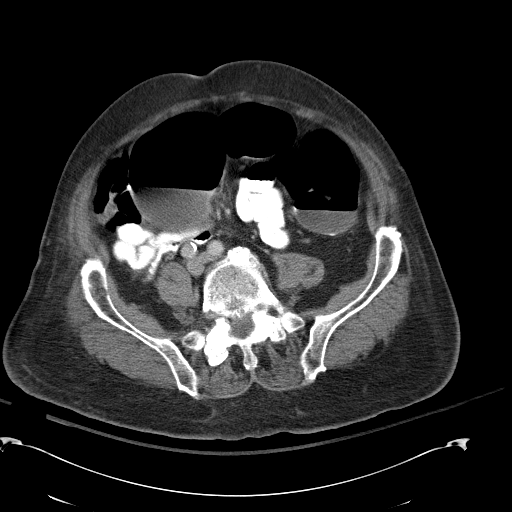
[im 47/105  soft-tissue]
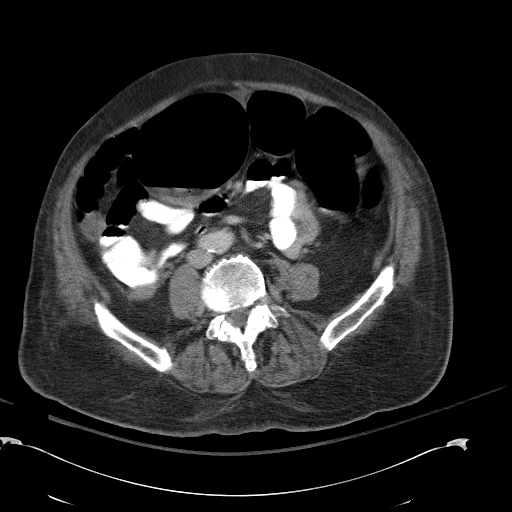
[im 58/105  soft-tissue]
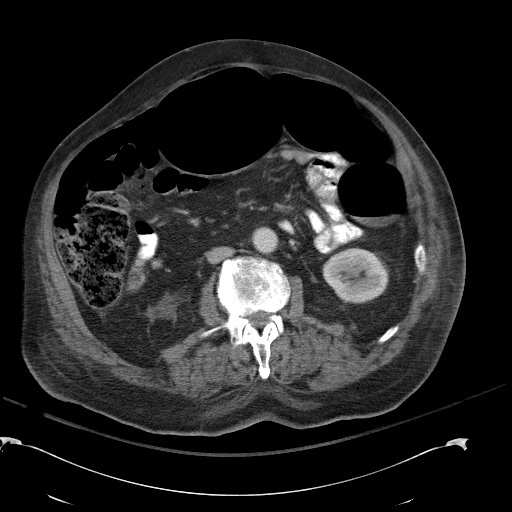
[im 63/105  soft-tissue]
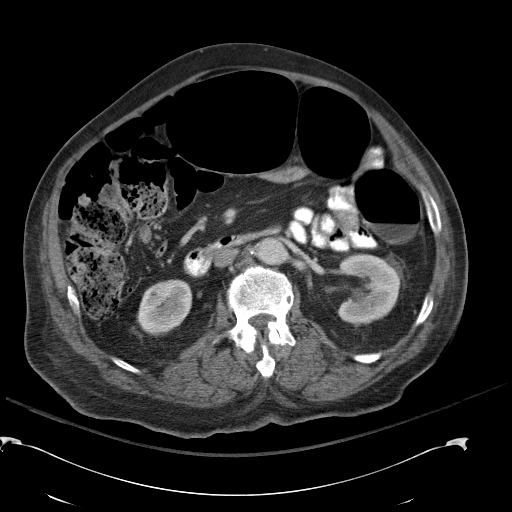
[im 63/105  bone]
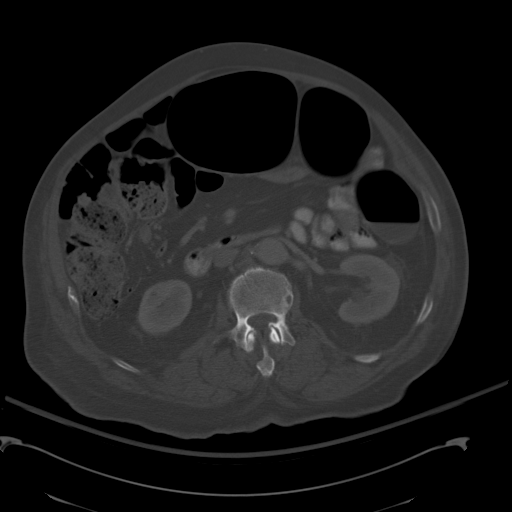
[im 68/105  soft-tissue]
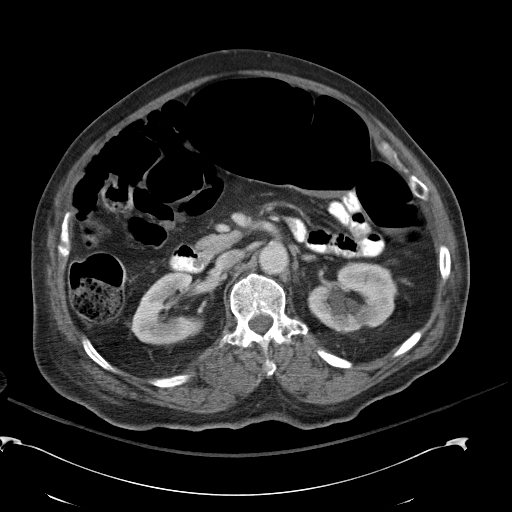
[im 79/105  soft-tissue]
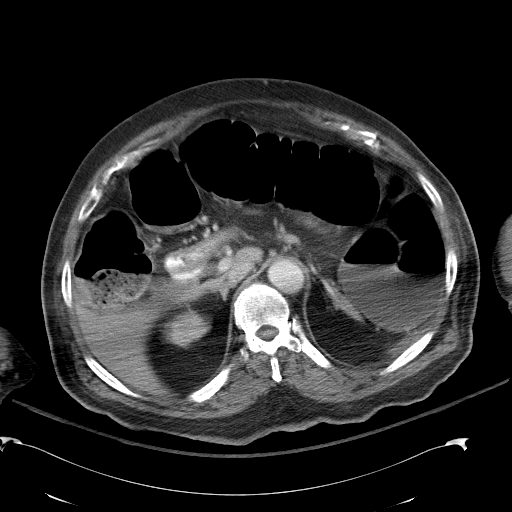
[im 84/105  soft-tissue]
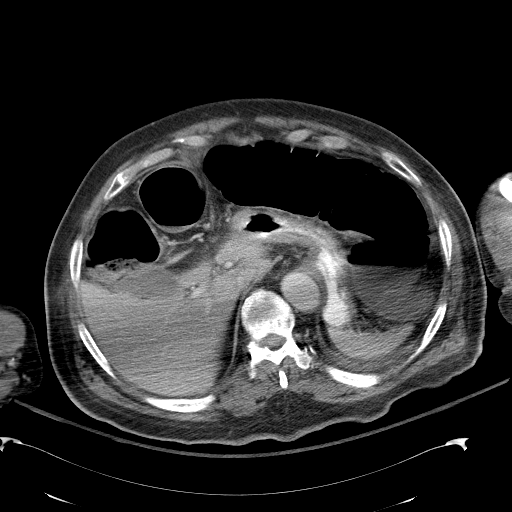
[im 89/105  soft-tissue]
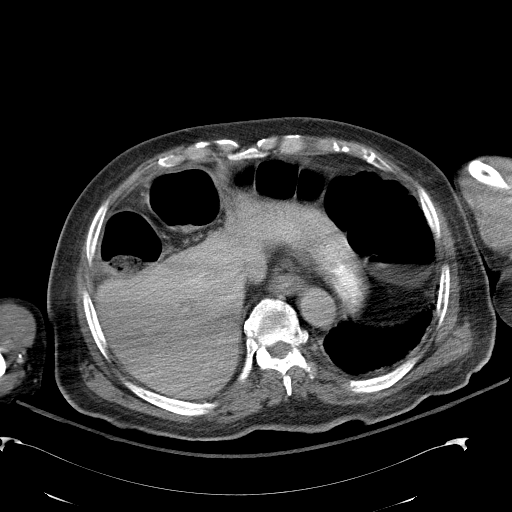
[im 99/105  soft-tissue]
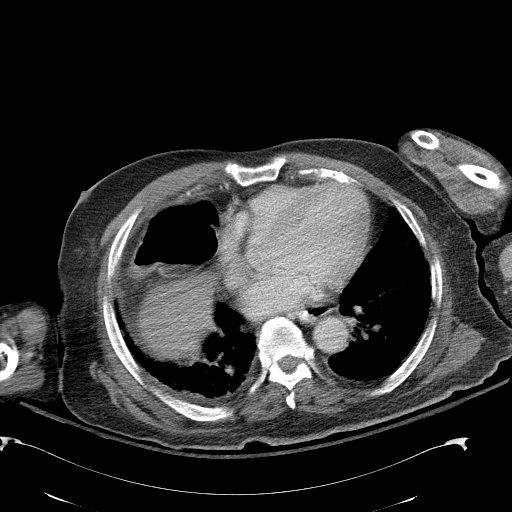

[Series 602: <mpr thick range> · coronal · 1.02mm/px · 3 of 163 slices shown]
[im 55/163  soft-tissue]
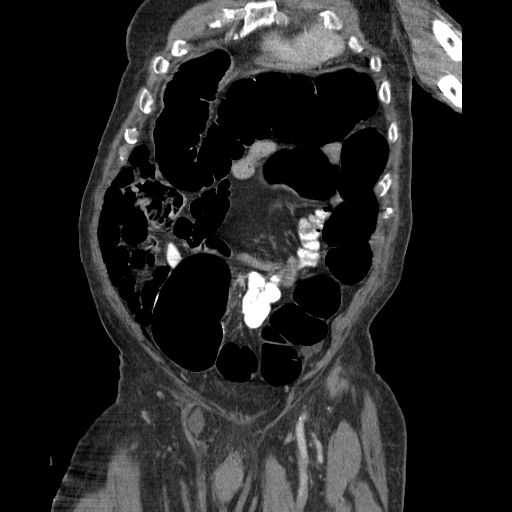
[im 73/163  soft-tissue]
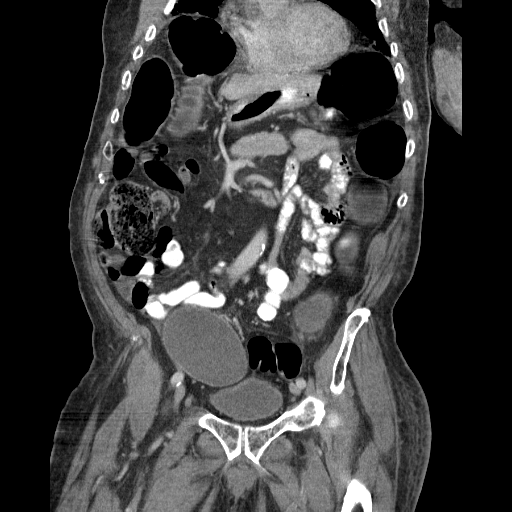
[im 91/163  soft-tissue]
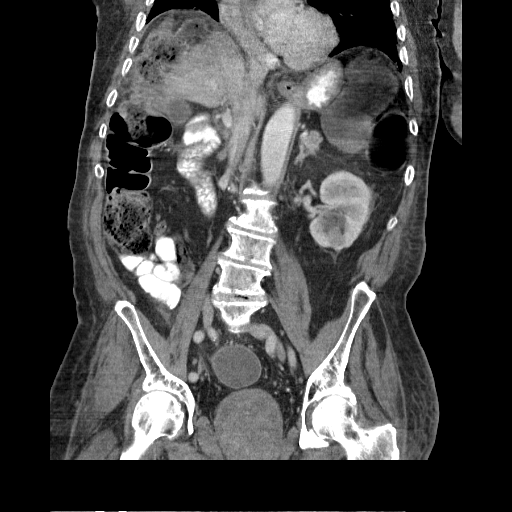

[17 of 46 positions shown; findings below may reference images not displayed]

FINDINGS: Lower chest: Trace right pleural effusion. Bilateral mild lower lobe
atelectasis.

Hepatobiliary: Negative

Pancreas: Negative

Spleen: Negative

Adrenals/Urinary Tract: 7 mm low-attenuation lesion lower pole left
kidney average attenuation value of -23. There is motion artifact.
It may represent a small angiomyolipoma. No hydronephrosis on either
side. Bladder is negative.

Stomach/Bowel: Diffuse large bowel dilatation. No evidence of
twisting to suggest volvulus. Furthermore there is no abrupt caliber
transition to suggest volvulus. Distal sigmoid and rectum measure
7.2 cm. Although there is little gas in the rectum, there is fluid
in the mid to distal sigmoid and rectum. No evidence of obstruction.
There are air-fluid levels throughout the large bowel, which is
distended up to a diameter of 8.5 cm in the transverse colon.

Vascular/Lymphatic: No pathologic adenopathy. No evidence of aortic
aneurysm.

Reproductive: Prostate enlarged at 7 cm

Other: Trace free fluid in the right upper quadrant

Musculoskeletal: Scoliosis of the thoracolumbar spine with
associated degenerative changes. No acute findings.
IMPRESSION: No evidence of sigmoid volvulus. Findings appear most consistent
with colonic ileus.

Significant enlargement of the prostate.  Correlate with PSA levels.

## 2017-12-27 ENCOUNTER — Inpatient Hospital Stay (HOSPITAL_COMMUNITY)
Admission: EM | Admit: 2017-12-27 | Discharge: 2017-12-31 | DRG: 291 | Disposition: A | Discharge status: Home Health | Payer: Medicare PPO | Attending: Internal Medicine | Admitting: Internal Medicine

## 2017-12-27 ENCOUNTER — Encounter (HOSPITAL_COMMUNITY): Payer: Self-pay | Admitting: Emergency Medicine

## 2017-12-27 ENCOUNTER — Other Ambulatory Visit: Payer: Self-pay

## 2017-12-27 ENCOUNTER — Other Ambulatory Visit (HOSPITAL_COMMUNITY): Payer: Medicare PPO

## 2017-12-27 ENCOUNTER — Emergency Department (HOSPITAL_COMMUNITY): Payer: Medicare PPO

## 2017-12-27 DIAGNOSIS — I5041 Acute combined systolic (congestive) and diastolic (congestive) heart failure: Secondary | ICD-10-CM | POA: Diagnosis not present

## 2017-12-27 DIAGNOSIS — K5900 Constipation, unspecified: Secondary | ICD-10-CM | POA: Diagnosis not present

## 2017-12-27 DIAGNOSIS — Z79899 Other long term (current) drug therapy: Secondary | ICD-10-CM

## 2017-12-27 DIAGNOSIS — N281 Cyst of kidney, acquired: Secondary | ICD-10-CM | POA: Diagnosis not present

## 2017-12-27 DIAGNOSIS — H409 Unspecified glaucoma: Secondary | ICD-10-CM

## 2017-12-27 DIAGNOSIS — I739 Peripheral vascular disease, unspecified: Secondary | ICD-10-CM | POA: Diagnosis present

## 2017-12-27 DIAGNOSIS — Z8673 Personal history of transient ischemic attack (TIA), and cerebral infarction without residual deficits: Secondary | ICD-10-CM

## 2017-12-27 DIAGNOSIS — I428 Other cardiomyopathies: Secondary | ICD-10-CM | POA: Diagnosis present

## 2017-12-27 DIAGNOSIS — Z66 Do not resuscitate: Secondary | ICD-10-CM | POA: Diagnosis present

## 2017-12-27 DIAGNOSIS — Z87891 Personal history of nicotine dependence: Secondary | ICD-10-CM

## 2017-12-27 DIAGNOSIS — I509 Heart failure, unspecified: Secondary | ICD-10-CM | POA: Diagnosis not present

## 2017-12-27 DIAGNOSIS — N189 Chronic kidney disease, unspecified: Secondary | ICD-10-CM | POA: Diagnosis not present

## 2017-12-27 DIAGNOSIS — R609 Edema, unspecified: Secondary | ICD-10-CM | POA: Diagnosis not present

## 2017-12-27 DIAGNOSIS — I080 Rheumatic disorders of both mitral and aortic valves: Secondary | ICD-10-CM | POA: Diagnosis present

## 2017-12-27 DIAGNOSIS — J810 Acute pulmonary edema: Secondary | ICD-10-CM | POA: Diagnosis not present

## 2017-12-27 DIAGNOSIS — I10 Essential (primary) hypertension: Secondary | ICD-10-CM | POA: Diagnosis present

## 2017-12-27 DIAGNOSIS — N400 Enlarged prostate without lower urinary tract symptoms: Secondary | ICD-10-CM | POA: Diagnosis not present

## 2017-12-27 DIAGNOSIS — N179 Acute kidney failure, unspecified: Secondary | ICD-10-CM

## 2017-12-27 DIAGNOSIS — K5904 Chronic idiopathic constipation: Secondary | ICD-10-CM | POA: Diagnosis not present

## 2017-12-27 DIAGNOSIS — I340 Nonrheumatic mitral (valve) insufficiency: Secondary | ICD-10-CM | POA: Diagnosis not present

## 2017-12-27 DIAGNOSIS — E876 Hypokalemia: Secondary | ICD-10-CM | POA: Diagnosis present

## 2017-12-27 DIAGNOSIS — I110 Hypertensive heart disease with heart failure: Principal | ICD-10-CM | POA: Diagnosis present

## 2017-12-27 DIAGNOSIS — I959 Hypotension, unspecified: Secondary | ICD-10-CM | POA: Diagnosis not present

## 2017-12-27 DIAGNOSIS — I5033 Acute on chronic diastolic (congestive) heart failure: Secondary | ICD-10-CM | POA: Diagnosis not present

## 2017-12-27 DIAGNOSIS — J9601 Acute respiratory failure with hypoxia: Secondary | ICD-10-CM | POA: Diagnosis present

## 2017-12-27 DIAGNOSIS — T502X5A Adverse effect of carbonic-anhydrase inhibitors, benzothiadiazides and other diuretics, initial encounter: Secondary | ICD-10-CM | POA: Diagnosis not present

## 2017-12-27 DIAGNOSIS — R0902 Hypoxemia: Secondary | ICD-10-CM

## 2017-12-27 LAB — CBC WITH DIFFERENTIAL/PLATELET
ABS IMMATURE GRANULOCYTES: 0 10*3/uL (ref 0.0–0.1)
BASOS PCT: 0 %
Basophils Absolute: 0 10*3/uL (ref 0.0–0.1)
Eosinophils Absolute: 0.2 10*3/uL (ref 0.0–0.7)
Eosinophils Relative: 3 %
HCT: 37.8 % — ABNORMAL LOW (ref 39.0–52.0)
Hemoglobin: 11.2 g/dL — ABNORMAL LOW (ref 13.0–17.0)
IMMATURE GRANULOCYTES: 1 %
LYMPHS ABS: 0.9 10*3/uL (ref 0.7–4.0)
Lymphocytes Relative: 16 %
MCH: 23.4 pg — ABNORMAL LOW (ref 26.0–34.0)
MCHC: 29.6 g/dL — ABNORMAL LOW (ref 30.0–36.0)
MCV: 79.1 fL (ref 78.0–100.0)
MONOS PCT: 9 %
Monocytes Absolute: 0.5 10*3/uL (ref 0.1–1.0)
NEUTROS ABS: 4.1 10*3/uL (ref 1.7–7.7)
NEUTROS PCT: 71 %
PLATELETS: 185 10*3/uL (ref 150–400)
RBC: 4.78 MIL/uL (ref 4.22–5.81)
RDW: 16.7 % — ABNORMAL HIGH (ref 11.5–15.5)
WBC: 5.8 10*3/uL (ref 4.0–10.5)

## 2017-12-27 LAB — BRAIN NATRIURETIC PEPTIDE: B NATRIURETIC PEPTIDE 5: 702.8 pg/mL — AB (ref 0.0–100.0)

## 2017-12-27 LAB — COMPREHENSIVE METABOLIC PANEL
ALT: 48 U/L — AB (ref 0–44)
AST: 55 U/L — AB (ref 15–41)
Albumin: 3.2 g/dL — ABNORMAL LOW (ref 3.5–5.0)
Alkaline Phosphatase: 42 U/L (ref 38–126)
Anion gap: 9 (ref 5–15)
BUN: 21 mg/dL (ref 8–23)
CHLORIDE: 109 mmol/L (ref 98–111)
CO2: 25 mmol/L (ref 22–32)
Calcium: 9.6 mg/dL (ref 8.9–10.3)
Creatinine, Ser: 1.39 mg/dL — ABNORMAL HIGH (ref 0.61–1.24)
GFR calc Af Amer: 48 mL/min — ABNORMAL LOW (ref >60)
GFR, EST NON AFRICAN AMERICAN: 41 mL/min — AB (ref >60)
GLUCOSE: 149 mg/dL — AB (ref 70–99)
Potassium: 4.4 mmol/L (ref 3.5–5.1)
Sodium: 143 mmol/L (ref 135–145)
Total Bilirubin: 0.7 mg/dL (ref 0.3–1.2)
Total Protein: 7.4 g/dL (ref 6.5–8.1)

## 2017-12-27 LAB — I-STAT TROPONIN, ED: Troponin i, poc: 0.06 ng/mL (ref 0.00–0.08)

## 2017-12-27 MED ORDER — FUROSEMIDE 10 MG/ML IJ SOLN
40.0000 mg | Freq: Two times a day (BID) | INTRAMUSCULAR | Status: DC
  Administered 2017-12-27 – 2017-12-30 (×6): 40 mg via INTRAVENOUS
  Filled 2017-12-27 (×7): qty 4

## 2017-12-27 MED ORDER — ENOXAPARIN SODIUM 40 MG/0.4ML ~~LOC~~ SOLN
40.0000 mg | SUBCUTANEOUS | Status: DC
  Administered 2017-12-27 – 2017-12-31 (×2): 40 mg via SUBCUTANEOUS
  Filled 2017-12-27 (×4): qty 0.4

## 2017-12-27 MED ORDER — POLYETHYLENE GLYCOL 3350 17 G PO PACK
17.0000 g | PACK | Freq: Every day | ORAL | Status: DC
  Administered 2017-12-27 – 2017-12-31 (×4): 17 g via ORAL
  Filled 2017-12-27 (×5): qty 1

## 2017-12-27 MED ORDER — ENSURE ENLIVE PO LIQD
237.0000 mL | Freq: Every day | ORAL | Status: DC
  Administered 2017-12-28: 237 mL via ORAL

## 2017-12-27 MED ORDER — SODIUM CHLORIDE 0.9% FLUSH
3.0000 mL | Freq: Two times a day (BID) | INTRAVENOUS | Status: DC
  Administered 2017-12-27 – 2017-12-31 (×9): 3 mL via INTRAVENOUS

## 2017-12-27 MED ORDER — ACETAMINOPHEN 325 MG PO TABS: 650.0000 mg | ORAL_TABLET | ORAL | Status: DC | PRN

## 2017-12-27 MED ORDER — FUROSEMIDE 10 MG/ML IJ SOLN
40.0000 mg | Freq: Once | INTRAMUSCULAR | Status: AC
  Administered 2017-12-27: 40 mg via INTRAVENOUS
  Filled 2017-12-27: qty 4

## 2017-12-27 MED ORDER — BRIMONIDINE TARTRATE 0.2 % OP SOLN
1.0000 [drp] | Freq: Two times a day (BID) | OPHTHALMIC | Status: DC
  Administered 2017-12-27 – 2017-12-30 (×7): 1 [drp] via OPHTHALMIC
  Filled 2017-12-27: qty 5

## 2017-12-27 MED ORDER — ADULT MULTIVITAMIN W/MINERALS CH
1.0000 | ORAL_TABLET | Freq: Every day | ORAL | Status: DC
  Administered 2017-12-27 – 2017-12-31 (×5): 1 via ORAL
  Filled 2017-12-27 (×5): qty 1

## 2017-12-27 MED ORDER — SODIUM CHLORIDE 0.9 % IV SOLN: 250.0000 mL | INTRAVENOUS | Status: DC | PRN

## 2017-12-27 MED ORDER — CARVEDILOL 3.125 MG PO TABS
3.1250 mg | ORAL_TABLET | Freq: Two times a day (BID) | ORAL | Status: DC
  Administered 2017-12-27 – 2017-12-31 (×9): 3.125 mg via ORAL
  Filled 2017-12-27 (×9): qty 1

## 2017-12-27 MED ORDER — DORZOLAMIDE HCL 2 % OP SOLN
1.0000 [drp] | Freq: Two times a day (BID) | OPHTHALMIC | Status: DC
  Administered 2017-12-28 – 2017-12-30 (×5): 1 [drp] via OPHTHALMIC
  Filled 2017-12-27: qty 10

## 2017-12-27 MED ORDER — SODIUM CHLORIDE 0.9% FLUSH: 3.0000 mL | INTRAVENOUS | Status: DC | PRN

## 2017-12-27 MED ORDER — BRIMONIDINE TARTRATE-TIMOLOL 0.2-0.5 % OP SOLN: 1.0000 [drp] | Freq: Two times a day (BID) | OPHTHALMIC | Status: DC

## 2017-12-27 MED ORDER — TIMOLOL MALEATE 0.5 % OP SOLN
1.0000 [drp] | Freq: Two times a day (BID) | OPHTHALMIC | Status: DC
  Administered 2017-12-27 – 2017-12-30 (×7): 1 [drp] via OPHTHALMIC
  Filled 2017-12-27: qty 5

## 2017-12-27 MED ORDER — DOCUSATE SODIUM 100 MG PO CAPS
100.0000 mg | ORAL_CAPSULE | Freq: Two times a day (BID) | ORAL | Status: DC
  Administered 2017-12-27 – 2017-12-31 (×8): 100 mg via ORAL
  Filled 2017-12-27 (×9): qty 1

## 2017-12-27 MED ORDER — ONDANSETRON HCL 4 MG/2ML IJ SOLN: 4.0000 mg | Freq: Four times a day (QID) | INTRAMUSCULAR | Status: DC | PRN

## 2017-12-27 MED ORDER — LOSARTAN POTASSIUM 50 MG PO TABS
50.0000 mg | ORAL_TABLET | Freq: Every day | ORAL | Status: DC
  Administered 2017-12-27 – 2017-12-30 (×4): 50 mg via ORAL
  Filled 2017-12-27 (×4): qty 1

## 2017-12-27 MED ORDER — ASPIRIN EC 81 MG PO TBEC
81.0000 mg | DELAYED_RELEASE_TABLET | Freq: Every day | ORAL | Status: DC
  Administered 2017-12-27 – 2017-12-31 (×5): 81 mg via ORAL
  Filled 2017-12-27 (×5): qty 1

## 2017-12-27 NOTE — Plan of Care (Signed)
  Problem: Elimination: Goal: Will not experience complications related to bowel motility Outcome: Progressing   Problem: Safety: Goal: Ability to remain free from injury will improve Outcome: Progressing   

## 2017-12-27 NOTE — ED Triage Notes (Signed)
Pt arrives via [facility] from home with SOB that began at 4am.  Pt woke up with audible wheezing, pitting edema bilateral lower extremities. 88% RA. Only medication is losartin. No meds yet today. ?CHF exacerbation. 3LNC  98% after 5mg  albuterol. 20g L wrist, RR-24-30, cbg 156, bp 190/118. 112 HR ST.

## 2017-12-27 NOTE — Progress Notes (Signed)
Patient is alert and oriented with not complaints o pains plan to have new HF work up, Teaching initiated for HF management with son-in law  at the bedside for teaching.

## 2017-12-27 NOTE — ED Notes (Signed)
[phone] Nita Sells, daughter.

## 2017-12-27 NOTE — H&P (Signed)
History and Physical    Jack Zuniga RUE:[id number] DOB: 06/07/1919 DOA: 12/27/2017  PCP: Devra Dopp, MD Consultants:  None Patient coming from:  Home - lives alone; NOK: Daughters  Chief Complaint: SOB  HPI: Jack Zuniga is a 84 y.o. male with medical history significant of CVA and HTN presenting with SOB.  "I don't know, they brought me here."  He was feeling SOB.  He doesn't remember much.  He denies chest pain.  +cough.  "I had a little problem, I don't really care to tell you that much now.  Will you be able to come back by here after 4 o"clock?  When you get 84 years old you can't remember much."  HPI per Dr. Dalene Seltzer:  84yo male with history of hypertension, CVA presents with concern for shortness of breath. Patient reports dyspnea began around 4Am this morning, woke up with wheezing and edema. O2 was 88% on room air which improved with 3Lnc with EMS. Yesterday was in normal state of health, then woke up with dyspnea. Sat up in chair and that didn't help. Received albuterol and O2 with EMS, feels alittle better, but continuing dyspnea. Mildly productive cough with white sputum. No recent fevers or chills, no recent illness/infections. Decrease appetite for a few days. No CP, no abdominal pain.    ED Course:  CHF exacerbation, new diagnosis, with hypoxia to 88%, improved on 3L O2.  +edema, JVD, pulmonary edema, elevated BNP.  40 mg IV Lasix.    Review of Systems: As per HPI; otherwise review of systems reviewed and negative.   Ambulatory Status:  Ambulates with a walker or cane  Past Medical History:  Diagnosis Date  . Hypertension   . Stroke [facility])    2011 in [place], no deficits    History reviewed. No pertinent surgical history.  Social History   Socioeconomic History  . Marital status: Widowed    Spouse name: Not on file  . Number of children: Not on file  . Years of education: Not on file  . Highest education level: Not on file  Occupational History  . Occupation:  retired  Engineer, production  . Financial resource strain: Not on file  . Food insecurity:    Worry: Not on file    Inability: Not on file  . Transportation needs:    Medical: Not on file    Non-medical: Not on file  Tobacco Use  . Smoking status: Former Smoker    Years: 30.00    Types: Cigars  . Smokeless tobacco: Never Used  Substance and Sexual Activity  . Alcohol use: No  . Drug use: No  . Sexual activity: Not on file  Lifestyle  . Physical activity:    Days per week: Not on file    Minutes per session: Not on file  . Stress: Not on file  Relationships  . Social connections:    Talks on phone: Not on file    Gets together: Not on file    Attends religious service: Not on file    Active member of club or organization: Not on file    Attends meetings of clubs or organizations: Not on file    Relationship status: Not on file  . Intimate partner violence:    Fear of current or ex partner: Not on file    Emotionally abused: Not on file    Physically abused: Not on file    Forced sexual activity: Not on file  Other Topics Concern  .  Not on file  Social History Narrative  . Not on file    No Known Allergies  Family History  Problem Relation Age of Onset  . Stroke Neg Hx   . Kidney disease Neg Hx     Prior to Admission medications   Medication Sig Start Date End Date Taking? Authorizing Provider  acetaminophen (TYLENOL) 325 MG tablet Take 650 mg by mouth every 6 (six) hours as needed (for pain.).    [provider]  COMBIGAN 0.2-0.5 % ophthalmic solution Place 1 drop into both eyes 2 (two) times daily. 12/24/14   [provider]  docusate sodium (COLACE) 100 MG capsule Take 1 capsule (100 mg total) by mouth 2 (two) times daily. 01/20/15   Rama, Maryruth Bun, MD  dorzolamide (TRUSOPT) 2 % ophthalmic solution Place 1 drop into both eyes 3 (three) times daily. 12/08/13   [provider]  feeding supplement, ENSURE ENLIVE, (ENSURE ENLIVE) LIQD Take 237  mLs by mouth every morning. 01/20/15   Rama, Maryruth Bun, MD  losartan (COZAAR) 50 MG tablet Take 50 mg by mouth daily.    [provider]  Multiple Vitamins-Minerals (ONE-A-DAY MENS 50+ ADVANTAGE) TABS Take 1 tablet by mouth daily.    [provider]  polyethylene glycol (MIRALAX / GLYCOLAX) packet Take 17 g by mouth daily. 01/20/15   Rama, Maryruth Bun, MD  Protein (PROCEL PO) Take 2 scoop by mouth 2 (two) times daily.    [provider]    Physical Exam: Vitals:   12/27/17 0900 12/27/17 0915 12/27/17 0930 12/27/17 0945  BP: 130/88 (!) 142/95 (!) 185/97 (!) 147/81  Pulse: 91 90 99 89  Resp: 16 17 (!) 27 16  Temp:      TempSrc:      SpO2: 97% 100% 100% 100%     General:  Appears calm and comfortable and is NAD Eyes:  PERRL, EOMI, normal lids, iris ENT:  grossly normal hearing, lips & tongue, mmm; appropriate dentition for age Neck:  no LAD, masses or thyromegaly; no carotid bruits Cardiovascular:  RRR, no m/r/g. 2+ distal LE edema.  Respiratory:   L > R bibasilar crackles, otherwise CTA.  Normal respiratory effort. Abdomen:  soft, NT, ND, NABS Skin:  no rash or induration seen on limited exam Musculoskeletal:  grossly normal tone BUE/BLE, good ROM, no bony abnormality Psychiatric:  grossly normal mood and affect, speech fluent and appropriate, AOx3 Neurologic:  CN 2-12 grossly intact, moves all extremities in coordinated fashion, sensation intact    Radiological Exams on Admission: Dg Chest 2 View  Result Date: 12/27/2017 CLINICAL DATA:  Shortness of breath, cough EXAM: CHEST - 2 VIEW COMPARISON:  01/19/2015 FINDINGS: Cardiomegaly. Diffuse interstitial and alveolar opacities throughout the lungs most compatible with edema. Probable small effusions. No acute bony abnormality. IMPRESSION: Cardiomegaly with diffuse interstitial and alveolar opacities, likely edema/CHF. Small effusions. Electronically Signed   By: Charlett Nose M.D.   On: 12/27/2017 08:31     EKG: Independently reviewed.  Sinus tachycardia with rate 105; nonspecific ST changes with no evidence of acute ischemia   Labs on Admission: I have personally reviewed the available labs and imaging studies at the time of the admission.  Pertinent labs:   Glucose 149 BUN 21/Creatinine 1.39/GFR 48; 18/1.09/66 in 5/19 Albumin 3.2 AST 55/ALT 48 BNP 702.8 Troponin 0.06 WBC 5.8 Hgb 11.2  Assessment/Plan Principal Problem:   CHF exacerbation (HCC) Active Problems:   Essential hypertension   Constipation   Glaucoma  New-onset CHF exacerbation -Patient with prior h/o respiratory failure presenting with SOB and hypoxia -CXR consistent with pulmonary edema -Normal WBC count -Elevated BNP -With elevated BNP and abnl CXR, new-onset CHF seems probable as diagnosis -Will admit with telemetry -Will request echocardiogram -Will start ASA -Will continue ARB -Will start low-dose Coreg -CHF order set utilized; may need CHF team consult but will hold until Echo results are available -Was given Lasix 40 mg x 1 in ER and will repeat with 40 mg IV BID -Continue [place] O2 for now prn -Repeat EKG in AM  HTN -Continue Losartan -Add Coreg, as above  Constipation -Continue Colace, Miralax  Glaucoma -Continue Combigan and Trusopt  DVT prophylaxis:  Lovenox Code Status: DNR - confirmed with patient Family Communication: None present Disposition Plan:  Home once clinically improved Consults called: CM, PT  Admission status: Admit - It is my clinical opinion that admission to INPATIENT is reasonable and necessary because of the expectation that this patient will require hospital care that crosses at least 2 midnights to treat this condition based on the medical complexity of the problems presented.  Given the aforementioned information, the predictability of an adverse outcome is felt to be significant.    Jonah Blue MD [facility]  If note is complete, please contact  covering daytime or nighttime physician. [email or web address] Password [facility]  12/27/2017, 10:32 AM

## 2017-12-27 NOTE — ED Provider Notes (Signed)
[facility] EMERGENCY DEPARTMENT Provider Note   CSN: [id number] Arrival date & time: 12/27/17  4098     History   Chief Complaint Chief Complaint  Patient presents with  . Shortness of Breath    HPI Jack Zuniga is a 84 y.o. male.  HPI   84yo male with history of hypertension, CVA presents with concern for shortness of breath.  Patient reports dyspnea began around 4Am this morning, woke up with wheezing and edema.  O2 was 88% on room air which improved with 3Lnc with EMS.  Yesterday was in normal state of health, then woke up with dyspnea.  Sat up in chair and that didn't help.  Received albuterol and O2 with EMS, feels alittle better, but continuing dyspnea.  Mildly productive cough with white sputum. No recent fevers or chills, no recent illness/infections.  Decrease appetite for a few days. No CP, no abdominal pain.    Past Medical History:  Diagnosis Date  . Hypertension   . Stroke [facility])    2011 in [place], no deficits    Patient Active Problem List   Diagnosis Date Noted  . Bilateral cataracts 01/28/2015  . Disorder of eyeball 01/28/2015  . Edema 01/28/2015  . Frequent falls 01/20/2015  . Neck pain 01/20/2015  . Protein-calorie malnutrition (HCC) 01/17/2015  . Constipation 01/17/2015  . Dehydration 01/16/2015  . Rhabdomyolysis 01/16/2015  . Essential hypertension 01/16/2015  . History of CVA (cerebrovascular accident) 01/16/2015    History reviewed. No pertinent surgical history.      Home Medications    Prior to Admission medications   Medication Sig Start Date End Date Taking? Authorizing Provider  acetaminophen (TYLENOL) 325 MG tablet Take 650 mg by mouth every 6 (six) hours as needed (for pain.).    [provider]  COMBIGAN 0.2-0.5 % ophthalmic solution Place 1 drop into both eyes 2 (two) times daily. 12/24/14   [provider]  docusate sodium (COLACE) 100 MG capsule Take 1 capsule (100 mg total) by mouth 2 (two) times  daily. 01/20/15   Rama, Maryruth Bun, MD  dorzolamide (TRUSOPT) 2 % ophthalmic solution Place 1 drop into both eyes 3 (three) times daily. 12/08/13   [provider]  feeding supplement, ENSURE ENLIVE, (ENSURE ENLIVE) LIQD Take 237 mLs by mouth every morning. 01/20/15   Rama, Maryruth Bun, MD  losartan (COZAAR) 50 MG tablet Take 50 mg by mouth daily.    [provider]  Multiple Vitamins-Minerals (ONE-A-DAY MENS 50+ ADVANTAGE) TABS Take 1 tablet by mouth daily.    [provider]  polyethylene glycol (MIRALAX / GLYCOLAX) packet Take 17 g by mouth daily. 01/20/15   Rama, Maryruth Bun, MD  Protein (PROCEL PO) Take 2 scoop by mouth 2 (two) times daily.    [provider]    Family History Family History  Problem Relation Age of Onset  . Stroke Neg Hx   . Kidney disease Neg Hx     Social History Social History   Tobacco Use  . Smoking status: Former Smoker    Types: Cigars  . Smokeless tobacco: Never Used  Substance Use Topics  . Alcohol use: No  . Drug use: No     Allergies   Patient has no known allergies.   Review of Systems Review of Systems  Constitutional: Negative for fever.  HENT: Negative for sore throat.   Eyes: Negative for visual disturbance.  Respiratory: Positive for cough, shortness of breath and wheezing.   Cardiovascular:  Negative for chest pain.  Gastrointestinal: Negative for abdominal pain, nausea and vomiting.  Genitourinary: Negative for difficulty urinating.  Musculoskeletal: Negative for back pain and neck stiffness.  Skin: Negative for rash.  Neurological: Negative for syncope and headaches.     Physical Exam Updated Vital Signs BP (!) 147/81   Pulse 89   Temp 98.5 F (36.9 C) (Oral)   Resp 16   SpO2 100%   Physical Exam  Constitutional: He is oriented to person, place, and time. He appears well-developed and well-nourished. No distress.  HENT:  Head: Normocephalic and atraumatic.  Eyes: Conjunctivae and  EOM are normal.  Neck: Normal range of motion. JVD present.  Cardiovascular: Regular rhythm, normal heart sounds and intact distal pulses. Tachycardia present. Exam reveals no gallop and no friction rub.  No murmur heard. Pulmonary/Chest: Effort normal. No respiratory distress. He has wheezes. He has rales (bibasilar).  Abdominal: Soft. He exhibits no distension. There is no tenderness. There is no guarding.  Musculoskeletal:       Right lower leg: He exhibits edema.       Left lower leg: He exhibits edema.  Neurological: He is alert and oriented to person, place, and time.  Skin: Skin is warm and dry. He is not diaphoretic.  Nursing note and vitals reviewed.    ED Treatments / Results  Labs (all labs ordered are listed, but only abnormal results are displayed) Labs Reviewed  CBC WITH DIFFERENTIAL/PLATELET - Abnormal; Notable for the following components:      Result Value   Hemoglobin 11.2 (*)    HCT 37.8 (*)    MCH 23.4 (*)    MCHC 29.6 (*)    RDW 16.7 (*)    All other components within normal limits  COMPREHENSIVE METABOLIC PANEL - Abnormal; Notable for the following components:   Glucose, Bld 149 (*)    Creatinine, Ser 1.39 (*)    Albumin 3.2 (*)    AST 55 (*)    ALT 48 (*)    GFR calc non Af Amer 41 (*)    GFR calc Af Amer 48 (*)    All other components within normal limits  BRAIN NATRIURETIC PEPTIDE - Abnormal; Notable for the following components:   B Natriuretic Peptide 702.8 (*)    All other components within normal limits  I-STAT TROPONIN, ED    EKG EKG Interpretation  Date/Time:  Thursday December 27 2017 07:24:45 EDT Ventricular Rate:  105 PR Interval:    QRS Duration: 106 QT Interval:  338 QTC Calculation: 447 R Axis:   20 Text Interpretation:  Sinus tachycardia LAE, consider biatrial enlargement Anteroseptal infarct, old Borderline repolarization abnormality Artifact No significant change since last tracing Confirmed by Alvira Monday ([id number]) on  12/27/2017 7:51:25 AM   Radiology Dg Chest 2 View  Result Date: 12/27/2017 CLINICAL DATA:  Shortness of breath, cough EXAM: CHEST - 2 VIEW COMPARISON:  01/19/2015 FINDINGS: Cardiomegaly. Diffuse interstitial and alveolar opacities throughout the lungs most compatible with edema. Probable small effusions. No acute bony abnormality. IMPRESSION: Cardiomegaly with diffuse interstitial and alveolar opacities, likely edema/CHF. Small effusions. Electronically Signed   By: Charlett Nose M.D.   On: 12/27/2017 08:31    Procedures Procedures (including critical care time)  Medications Ordered in ED Medications  furosemide (LASIX) injection 40 mg (40 mg Intravenous Given 12/27/17 0918)     Initial Impression / Assessment and Plan / ED Course  I have reviewed the triage vital signs and the nursing notes.  Pertinent labs & imaging results that were available during my care of the patient were reviewed by me and considered in my medical decision making (see chart for details).     84yo male with history of hypertension, CVA presents with concern for shortness of breath.  Differential diagnosis for dyspnea includes ACS, PE, bronchitis, CHF exacerbation, anemia, pneumonia.  EKG was evaluated by me which showed sinus tachycardia. History and physical exam concerning for CHF.  CXR confirms edema and BNP elevated to 700s.  Given 40mg  IV lasix, will admit for acute hypoxic respiratory failure secondary to CHF.  Final Clinical Impressions(s) / ED Diagnoses   Final diagnoses:  Acute congestive heart failure, unspecified heart failure type [facility])  Hypoxia    ED Discharge Orders    None       Alvira Monday, MD 12/27/17 1004

## 2017-12-27 NOTE — Evaluation (Signed)
Physical Therapy Evaluation Patient Details Name: Jack Zuniga MRN: [id number] DOB: 03-27-1920 Today's Date: 12/27/2017   History of Present Illness  Jack Zuniga is a 84 y.o. male with medical history significant of CVA and HTN presenting with SOB. Admitted for CHF exacerbation.  Clinical Impression  Patient presents with decreased independence with mobility due to generalized weakness and decreased balance with recent fall at home.  Feel he can return home with initial 24 hour assist and follow up HHPT.  PT to follow acutely.    Follow Up Recommendations Home health PT;Supervision/Assistance - 24 hour    Equipment Recommendations  None recommended by PT    Recommendations for Other Services       Precautions / Restrictions Precautions Precautions: Fall Precaution Comments: reports recent fall near tub when his leg gave away and he dropped onto his knee      Mobility  Bed Mobility               General bed mobility comments: up in chair  Transfers Overall transfer level: Needs assistance Equipment used: Rolling walker (2 wheeled) Transfers: Sit to/from Stand Sit to Stand: Supervision         General transfer comment: UE use on armrests  Ambulation/Gait Ambulation/Gait assistance: Min guard;Supervision Gait Distance (Feet): 200 Feet Assistive device: Rolling walker (2 wheeled) Gait Pattern/deviations: Step-through pattern;Decreased stride length;Trunk flexed;Shuffle;Narrow base of support     General Gait Details: no LOB, did run into obstacles in hall and in room with S level assist to reposition walker around safely.  Cues for forward gaze and posture  Stairs            Wheelchair Mobility    Modified Rankin (Stroke Patients Only)       Balance Overall balance assessment: Needs assistance;History of Falls Sitting-balance support: No upper extremity supported Sitting balance-Leahy Scale: Good     Standing balance support: Bilateral upper  extremity supported Standing balance-Leahy Scale: Fair Standing balance comment: UE support for ambulation, can stand without UE assist                             Pertinent Vitals/Pain Pain Assessment: No/denies pain    Home Living Family/patient expects to be discharged to:: Private residence Living Arrangements: Alone Available Help at Discharge: Family;Available PRN/intermittently Type of Home: Apartment Home Access: Level entry     Home Layout: One level Home Equipment: Walker - 2 wheels;Cane - single point      Prior Function Level of Independence: Independent with assistive device(s)         Comments: uses cane mostly     Hand Dominance        Extremity/Trunk Assessment   Upper Extremity Assessment Upper Extremity Assessment: Overall WFL for tasks assessed    Lower Extremity Assessment Lower Extremity Assessment: Overall WFL for tasks assessed       Communication   Communication: HOH  Cognition Arousal/Alertness: Awake/alert Behavior During Therapy: WFL for tasks assessed/performed Overall Cognitive Status: Within Functional Limits for tasks assessed                                        General Comments General comments (skin integrity, edema, etc.): SpO2 >90% on RA throughout     Exercises     Assessment/Plan    PT Assessment Patient needs continued PT services  PT Problem List Decreased balance;Decreased knowledge of use of DME;Decreased safety awareness;Decreased knowledge of precautions       PT Treatment Interventions DME instruction;Therapeutic activities;Gait training;Therapeutic exercise;Balance training;Functional mobility training    PT Goals (Current goals can be found in the Care Plan section)  Acute Rehab PT Goals Patient Stated Goal: to go home PT Goal Formulation: With patient Time For Goal Achievement: 01/03/18 Potential to Achieve Goals: Good    Frequency Min 3X/week   Barriers to  discharge        Co-evaluation               AM-PAC PT "6 Clicks" Daily Activity  Outcome Measure Difficulty turning over in bed (including adjusting bedclothes, sheets and blankets)?: None Difficulty moving from lying on back to sitting on the side of the bed? : A Jack Difficulty sitting down on and standing up from a chair with arms (e.g., wheelchair, bedside commode, etc,.)?: A Jack Help needed moving to and from a bed to chair (including a wheelchair)?: A Jack Help needed walking in hospital room?: A Jack Help needed climbing 3-5 steps with a railing? : A Jack 6 Click Score: 19    End of Session Equipment Utilized During Treatment: Gait belt Activity Tolerance: Patient tolerated treatment well Patient left: with call bell/phone within reach;in chair;with chair alarm set   PT Visit Diagnosis: Other abnormalities of gait and mobility (R26.89);History of falling (Z91.81)    Time: 2202-5427 PT Time Calculation (min) (ACUTE ONLY): 23 min   Charges:   PT Evaluation $PT Eval Moderate Complexity: 1 Mod PT Treatments $Gait Training: 8-22 mins        Sheran Lawless, [facility] [phone] 12/27/2017   Elray Mcgregor 12/27/2017, 4:44 PM

## 2017-12-28 ENCOUNTER — Inpatient Hospital Stay (HOSPITAL_COMMUNITY): Payer: Medicare PPO

## 2017-12-28 DIAGNOSIS — I739 Peripheral vascular disease, unspecified: Secondary | ICD-10-CM

## 2017-12-28 DIAGNOSIS — J810 Acute pulmonary edema: Secondary | ICD-10-CM

## 2017-12-28 DIAGNOSIS — K5904 Chronic idiopathic constipation: Secondary | ICD-10-CM

## 2017-12-28 DIAGNOSIS — I5033 Acute on chronic diastolic (congestive) heart failure: Secondary | ICD-10-CM

## 2017-12-28 DIAGNOSIS — N179 Acute kidney failure, unspecified: Secondary | ICD-10-CM

## 2017-12-28 LAB — CBC WITH DIFFERENTIAL/PLATELET
ABS IMMATURE GRANULOCYTES: 0 10*3/uL (ref 0.0–0.1)
Basophils Absolute: 0 10*3/uL (ref 0.0–0.1)
Basophils Relative: 0 %
Eosinophils Absolute: 0.2 10*3/uL (ref 0.0–0.7)
Eosinophils Relative: 4 %
HEMATOCRIT: 37.7 % — AB (ref 39.0–52.0)
Hemoglobin: 11.4 g/dL — ABNORMAL LOW (ref 13.0–17.0)
IMMATURE GRANULOCYTES: 0 %
LYMPHS ABS: 1.3 10*3/uL (ref 0.7–4.0)
Lymphocytes Relative: 28 %
MCH: 23.6 pg — AB (ref 26.0–34.0)
MCHC: 30.2 g/dL (ref 30.0–36.0)
MCV: 77.9 fL — AB (ref 78.0–100.0)
MONOS PCT: 14 %
Monocytes Absolute: 0.6 10*3/uL (ref 0.1–1.0)
NEUTROS ABS: 2.5 10*3/uL (ref 1.7–7.7)
NEUTROS PCT: 54 %
Platelets: 184 10*3/uL (ref 150–400)
RBC: 4.84 MIL/uL (ref 4.22–5.81)
RDW: 16.3 % — ABNORMAL HIGH (ref 11.5–15.5)
WBC: 4.6 10*3/uL (ref 4.0–10.5)

## 2017-12-28 LAB — TROPONIN I
Troponin I: 0.04 ng/mL (ref <0.03)
Troponin I: 0.04 ng/mL (ref <0.03)

## 2017-12-28 LAB — BASIC METABOLIC PANEL
ANION GAP: 12 (ref 5–15)
BUN: 23 mg/dL (ref 8–23)
CHLORIDE: 102 mmol/L (ref 98–111)
CO2: 28 mmol/L (ref 22–32)
Calcium: 9.7 mg/dL (ref 8.9–10.3)
Creatinine, Ser: 1.47 mg/dL — ABNORMAL HIGH (ref 0.61–1.24)
GFR calc Af Amer: 45 mL/min — ABNORMAL LOW (ref >60)
GFR calc non Af Amer: 38 mL/min — ABNORMAL LOW (ref >60)
GLUCOSE: 99 mg/dL (ref 70–99)
POTASSIUM: 4 mmol/L (ref 3.5–5.1)
Sodium: 142 mmol/L (ref 135–145)

## 2017-12-28 NOTE — Progress Notes (Signed)
Patient refused ECHO . information and teaching about this procedure was given to patient four times. Patient states that he understand risks and benefits.  MD attending notified.

## 2017-12-28 NOTE — Progress Notes (Addendum)
Physical Therapy Treatment Patient Details Name: Jack Zuniga MRN: [id number] DOB: 1919/06/10 Today's Date: 12/28/2017    History of Present Illness Pt is a 84 y.o. male admitted 12/27/17 with c/o SOB; worked up for CHF exacerbation. PMH includes HTN, CVA.    PT Comments    Pt performs transfers and ambulation with RW at supervision-level; intermittently confused throughout session. Pt agreeable that he would benefit from use of RW instead of SPC he was using PTA. Pt reports family should be able to provide support upon return home; recommend initial 24/7 supervision. Pt very motivated to maintain independence. Will continue to follow acutely.   Follow Up Recommendations  Home health PT;Supervision/Assistance - 24 hour     Equipment Recommendations  None recommended by PT    Recommendations for Other Services       Precautions / Restrictions Precautions Precautions: Fall Restrictions Weight Bearing Restrictions: No    Mobility  Bed Mobility Overal bed mobility: Modified Independent             General bed mobility comments: Received sitting in recliner. Return to supine mod indep  Transfers Overall transfer level: Needs assistance Equipment used: Rolling walker (2 wheeled) Transfers: Sit to/from Stand Sit to Stand: Supervision         General transfer comment: Stood from recliner and bed with RW and supervision; reliance on BUEs to push into standing  Ambulation/Gait Ambulation/Gait assistance: Supervision   Assistive device: Rolling walker (2 wheeled) Gait Pattern/deviations: Step-through pattern;Decreased stride length;Trunk flexed Gait velocity: Decreased   General Gait Details: Pt only agreeable to in-room ambulation; navigated room well with RW and supervision for safety   Stairs             Wheelchair Mobility    Modified Rankin (Stroke Patients Only)       Balance Overall balance assessment: Needs assistance;History of  Falls Sitting-balance support: No upper extremity supported Sitting balance-Leahy Scale: Good       Standing balance-Leahy Scale: Fair Standing balance comment: Can static stand without UE support; reliant on UE for dynamic stability                            Cognition Arousal/Alertness: Awake/alert Behavior During Therapy: WFL for tasks assessed/performed Overall Cognitive Status: No family/caregiver present to determine baseline cognitive functioning Area of Impairment: Attention;Memory;Safety/judgement;Awareness                   Current Attention Level: Selective Memory: Decreased short-term memory   Safety/Judgement: Decreased awareness of safety;Decreased awareness of deficits Awareness: Emergent   General Comments: Likely baseline cognition. Question h/o dementia      Exercises      General Comments        Pertinent Vitals/Pain Pain Assessment: No/denies pain    Home Living                      Prior Function            PT Goals (current goals can now be found in the care plan section) Acute Rehab PT Goals Patient Stated Goal: to go home PT Goal Formulation: With patient Time For Goal Achievement: 01/03/18 Potential to Achieve Goals: Good Progress towards PT goals: Progressing toward goals    Frequency    Min 3X/week      PT Plan Current plan remains appropriate    Co-evaluation  AM-PAC PT "6 Clicks" Daily Activity  Outcome Measure  Difficulty turning over in bed (including adjusting bedclothes, sheets and blankets)?: None Difficulty moving from lying on back to sitting on the side of the bed? : None Difficulty sitting down on and standing up from a chair with arms (e.g., wheelchair, bedside commode, etc,.)?: A Little Help needed moving to and from a bed to chair (including a wheelchair)?: A Little Help needed walking in hospital room?: A Little Help needed climbing 3-5 steps with a railing? : A  Little 6 Click Score: 20    End of Session Equipment Utilized During Treatment: Gait belt Activity Tolerance: Patient limited by fatigue Patient left: in bed;with call bell/phone within reach;with bed alarm set Nurse Communication: Mobility status PT Visit Diagnosis: Other abnormalities of gait and mobility (R26.89);History of falling (Z91.81)     Time: 1610-9604 PT Time Calculation (min) (ACUTE ONLY): 16 min  Charges:  $Therapeutic Activity: 8-22 mins                    Ina Homes, PT, DPT Acute Rehabilitation Services  Pager [phone] Office [phone]  Malachy Chamber 12/28/2017, 5:05 PM

## 2017-12-28 NOTE — Progress Notes (Signed)
PROGRESS NOTE    Jack Zuniga  WUJ:[id number] DOB: [birth year] DOA: 12/27/2017 PCP: Devra Dopp, MD  Outpatient Specialists:   Brief Narrative:  Patient is a 84 year old male with past medical history significant for hypertension and stroke.  Patient was admitted with what appears to be flash pulmonary edema, and acute kidney injury versus acute kidney injury on chronic kidney disease.  With diuresis, patient's shortness of breath has improved significantly, but worsening renal function is noted.  Wheezing has resolved.  Troponin on presentation was 0.06.  No repeat troponin visualized.  EKG is noted.  Will repeat troponin.  We will pursue renal ultrasound.  Will have a low threshold to rule out renal artery stenosis.  Right lower leg/foot is cool to touch.  Will order arterial Doppler ultrasound of the lower extremities.  We will also repeat troponin.  No chest pain reported.  At some point, there will be need to define goal of care.  Patient refused echocardiogram earlier today.  Will have a low threshold to consult palliative care team.  Prognosis is guarded.   Assessment & Plan:   Principal Problem:   CHF exacerbation (HCC) Active Problems:   Essential hypertension   Constipation   Glaucoma     New-onset CHF exacerbation/flash pulmonary edema: -Patient with prior h/o respiratory failure presenting with SOB and hypoxia -CXR consistent with pulmonary edema -Normal WBC count -Elevated BNP -With elevated BNP and abnl CXR, new-onset CHF seems probable as diagnosis -Will admit with telemetry -Will request echocardiogram -Will start ASA -Will continue ARB -Will start low-dose Coreg -CHF order set utilized; may need CHF team consult but will hold until Echo results are available -Was given Lasix 40 mg x 1 in ER and will repeat with 40 mg IV BID -Continue [place] O2 for now prn -Repeat EKG in AM  12/28/2017: Repeat troponin.  Echocardiogram in the morning.  Low threshold to work patient  up for possible renal artery stenosis.  Continue to monitor renal function and electrolytes closely.  Further management will depend on hospital course.  Possible peripheral artery disease: Arterial Doppler ultrasound of lower extremities. Low threshold to rule out bilateral renal arteries stenosis.  Acute kidney injury versus acute kidney injury of chronic kidney disease: Renal ultrasound. Check urine sodium, protein and electrolytes. Avoid nephrotoxins. Dose of medication assuming GFR of 15 mils per minute. Continue to monitor renal function and electrolytes closely.  HTN -Continue Losartan -Add Coreg, as above  Constipation -Continue Colace, Miralax  Glaucoma -Continue Combigan and Trusopt  DVT prophylaxis:  Lovenox Code Status: DNR - confirmed with patient Family Communication: Disposition Plan:  Home once clinically improved Consults called: CM, PT.   Procedures:   For echocardiogram in the morning.  Patient refused echocardiogram later today  Antimicrobials:   None   Subjective: Shortness of breath has resolved Wheezing has resolved. No chest pain.  Objective: Vitals:   12/28/17 0412 12/28/17 0527 12/28/17 1032 12/28/17 1219  BP:  (!) 151/89 130/80 130/76  Pulse:  86 87 91  Resp:  18  20  Temp:  97.7 F (36.5 C) (!) 97.3 F (36.3 C) 98 F (36.7 C)  TempSrc:  Oral Oral Oral  SpO2:  96% 94% 97%  Weight: 87 kg     Height:        Intake/Output Summary (Last 24 hours) at 12/28/2017 1716 Last data filed at 12/28/2017 1521 Gross per 24 hour  Intake 480 ml  Output 1550 ml  Net -1070 ml   [employer]  12/27/17 1126 12/28/17 0412  Weight: 89.5 kg 87 kg    Examination:  General exam: Appears calm and comfortable.  Patient is pale. Respiratory system: Clear to auscultation. Respiratory effort normal. Cardiovascular system: S1 & S2 heard. No pedal edema. Gastrointestinal system: Abdomen is nondistended, soft and nontender. No organomegaly or  masses felt. Normal bowel sounds heard. Central nervous system: Alert and oriented. No focal neurological deficits. Extremities: Symmetric 5 x 5 power.  No leg edema.   Data Reviewed: I have personally reviewed following labs and imaging studies  CBC: Recent Labs  Lab 12/27/17 0747 12/28/17 0008  WBC 5.8 4.6  NEUTROABS 4.1 2.5  HGB 11.2* 11.4*  HCT 37.8* 37.7*  MCV 79.1 77.9*  PLT 185 184   Basic Metabolic Panel: Recent Labs  Lab 12/27/17 0747 12/28/17 0008  NA 143 142  K 4.4 4.0  CL 109 102  CO2 25 28  GLUCOSE 149* 99  BUN 21 23  CREATININE 1.39* 1.47*  CALCIUM 9.6 9.7   GFR: Estimated Creatinine Clearance: 31.3 mL/min (A) (by C-G formula based on SCr of 1.47 mg/dL (H)). Liver Function Tests: Recent Labs  Lab 12/27/17 0747  AST 55*  ALT 48*  ALKPHOS 42  BILITOT 0.7  PROT 7.4  ALBUMIN 3.2*   No results for input(s): LIPASE, AMYLASE in the last 168 hours. No results for input(s): AMMONIA in the last 168 hours. Coagulation Profile: No results for input(s): INR, PROTIME in the last 168 hours. Cardiac Enzymes: No results for input(s): CKTOTAL, CKMB, CKMBINDEX, TROPONINI in the last 168 hours. BNP (last 3 results) No results for input(s): PROBNP in the last 8760 hours. HbA1C: No results for input(s): HGBA1C in the last 72 hours. CBG: No results for input(s): GLUCAP in the last 168 hours. Lipid Profile: No results for input(s): CHOL, HDL, LDLCALC, TRIG, CHOLHDL, LDLDIRECT in the last 72 hours. Thyroid Function Tests: No results for input(s): TSH, T4TOTAL, FREET4, T3FREE, THYROIDAB in the last 72 hours. Anemia Panel: No results for input(s): VITAMINB12, FOLATE, FERRITIN, TIBC, IRON, RETICCTPCT in the last 72 hours. Urine analysis:    Component Value Date/Time   COLORURINE YELLOW 01/16/2015 0214   APPEARANCEUR CLOUDY (A) 01/16/2015 0214   LABSPEC 1.024 01/16/2015 0214   PHURINE 5.5 01/16/2015 0214   GLUCOSEU NEGATIVE 01/16/2015 0214   HGBUR LARGE (A)  01/16/2015 0214   BILIRUBINUR NEGATIVE 01/16/2015 0214   KETONESUR 40 (A) 01/16/2015 0214   PROTEINUR 100 (A) 01/16/2015 0214   UROBILINOGEN 1.0 01/16/2015 0214   NITRITE NEGATIVE 01/16/2015 0214   LEUKOCYTESUR NEGATIVE 01/16/2015 0214   Sepsis Labs: @LABRCNTIP (procalcitonin:4,lacticidven:4)  )No results found for this or any previous visit (from the past 240 hour(s)).       Radiology Studies: Dg Chest 2 View  Result Date: 12/27/2017 CLINICAL DATA:  Shortness of breath, cough EXAM: CHEST - 2 VIEW COMPARISON:  01/19/2015 FINDINGS: Cardiomegaly. Diffuse interstitial and alveolar opacities throughout the lungs most compatible with edema. Probable small effusions. No acute bony abnormality. IMPRESSION: Cardiomegaly with diffuse interstitial and alveolar opacities, likely edema/CHF. Small effusions. Electronically Signed   By: Charlett Nose M.D.   On: 12/27/2017 08:31        Scheduled Meds: . aspirin EC  81 mg Oral Daily  . brimonidine  1 drop Both Eyes BID   And  . timolol  1 drop Both Eyes BID  . carvedilol  3.125 mg Oral BID WC  . docusate sodium  100 mg Oral BID  . dorzolamide  1  drop Both Eyes BID  . enoxaparin (LOVENOX) injection  40 mg Subcutaneous Q24H  . feeding supplement (ENSURE ENLIVE)  237 mL Oral Daily  . furosemide  40 mg Intravenous Q12H  . losartan  50 mg Oral Daily  . multivitamin with minerals  1 tablet Oral Daily  . polyethylene glycol  17 g Oral Daily  . sodium chloride flush  3 mL Intravenous Q12H   Continuous Infusions: . sodium chloride       LOS: 1 day    Time spent: 35 minutes    Berton Mount, MD  [facility] Pager #: [phone] 7PM-7AM contact night coverage as above

## 2017-12-28 NOTE — Plan of Care (Addendum)
  Problem: Education: Goal: Ability to demonstrate management of disease process will improve 12/28/2017 1842 by Julianne Handler, RN Outcome: Progressing Note:  Continuous education given to patient 12/28/2017 1841 by Julianne Handler, RN Outcome: Progressing   Problem: Education: Goal: Ability to verbalize understanding of medication therapies will improve 12/28/2017 1842 by Julianne Handler, RN Outcome: Progressing Note:  Patient asks for explanation of medications, education was given. Patient needs continuous medication education. 12/28/2017 1841 by Julianne Handler, RN Outcome: Progressing   Problem: Cardiac: Goal: Ability to achieve and maintain adequate cardiopulmonary perfusion will improve 12/28/2017 1842 by Julianne Handler, RN Outcome: Progressing 12/28/2017 1841 by Julianne Handler, RN Outcome: Progressing   Problem: Education: Goal: Knowledge of General Education information will improve Description Including pain rating scale, medication(s)/side effects and non-pharmacologic comfort measures 12/28/2017 1842 by Julianne Handler, RN Outcome: Progressing Note:  General education given to patient and family mem Problem: Nutrition: Goal: Adequate nutrition will be maintained 12/28/2017 1842 by Julianne Handler, RN Outcome: Progressing Note:  Patient is reminded of importance of good nutrition and eating meals. 12/28/2017 1841 by Julianne Handler, RN Outcome: Progressing   Problem: Pain Managment: Goal: General experience of comfort will improve Outcome: Progressing   Problem: Safety: Goal: Ability to remain free from injury will improve Outcome: Completed/Met   Problem: Skin Integrity: Goal: Risk for impaired skin integrity will decrease 12/28/2017 1842 by Julianne Handler, RN Outcome: Progressing

## 2017-12-28 NOTE — Progress Notes (Signed)
CRITICAL VALUE ALERT  Critical Value: Troponin 0.04  Date & Time Notied:  12/28/2017  Provider Notified: Berton Mount MD  Orders Received/Actions taken: MD notified. No new orders. Patient is currently sitting in chair eating his dinner.

## 2017-12-28 NOTE — Care Management Note (Signed)
Case Management Note  Patient Details  Name: Jack Zuniga MRN: [id number] Date of Birth: 04/12/1920  Subjective/Objective:   CHF                Action/Plan: Patient lives at home alone; PCP: Devra Dopp, MD; has [facility] Medicare with prescription drug coverage; patient is confused, taking his clothes off; TCT patient's daughters [name] and [name], voice message left to discuss disposition needs; CM will continue to follow.  Expected Discharge Date:    possibly 12/30/2017              Expected Discharge Plan:  Home w Home Health Services  Discharge planning Services  CM Consult  Status of Service:  In process, will continue to follow  Reola Mosher [phone] 12/28/2017, 3:33 PM

## 2017-12-29 ENCOUNTER — Inpatient Hospital Stay (HOSPITAL_COMMUNITY): Payer: Medicare PPO

## 2017-12-29 ENCOUNTER — Encounter (HOSPITAL_COMMUNITY): Payer: Medicare PPO

## 2017-12-29 DIAGNOSIS — I509 Heart failure, unspecified: Secondary | ICD-10-CM

## 2017-12-29 DIAGNOSIS — I340 Nonrheumatic mitral (valve) insufficiency: Secondary | ICD-10-CM

## 2017-12-29 DIAGNOSIS — I10 Essential (primary) hypertension: Secondary | ICD-10-CM

## 2017-12-29 LAB — URINALYSIS, ROUTINE W REFLEX MICROSCOPIC
Bilirubin Urine: NEGATIVE
Glucose, UA: NEGATIVE mg/dL
Ketones, ur: NEGATIVE mg/dL
Nitrite: NEGATIVE
Protein, ur: NEGATIVE mg/dL
Specific Gravity, Urine: 1.009 (ref 1.005–1.030)
pH: 5 (ref 5.0–8.0)

## 2017-12-29 LAB — RENAL FUNCTION PANEL
Albumin: 3.3 g/dL — ABNORMAL LOW (ref 3.5–5.0)
Anion gap: 11 (ref 5–15)
BUN: 27 mg/dL — ABNORMAL HIGH (ref 8–23)
CO2: 30 mmol/L (ref 22–32)
Calcium: 9.6 mg/dL (ref 8.9–10.3)
Chloride: 101 mmol/L (ref 98–111)
Creatinine, Ser: 1.52 mg/dL — ABNORMAL HIGH (ref 0.61–1.24)
GFR calc Af Amer: 43 mL/min — ABNORMAL LOW (ref >60)
GFR calc non Af Amer: 37 mL/min — ABNORMAL LOW (ref >60)
Glucose, Bld: 121 mg/dL — ABNORMAL HIGH (ref 70–99)
Phosphorus: 3.8 mg/dL (ref 2.5–4.6)
Potassium: 3.5 mmol/L (ref 3.5–5.1)
Sodium: 142 mmol/L (ref 135–145)

## 2017-12-29 LAB — SODIUM, URINE, RANDOM: Sodium, Ur: 99 mmol/L

## 2017-12-29 LAB — TROPONIN I: Troponin I: 0.04 ng/mL (ref <0.03)

## 2017-12-29 LAB — MAGNESIUM: Magnesium: 1.9 mg/dL (ref 1.7–2.4)

## 2017-12-29 LAB — CREATININE, URINE, RANDOM: Creatinine, Urine: 66.97 mg/dL

## 2017-12-29 LAB — BRAIN NATRIURETIC PEPTIDE: B Natriuretic Peptide: 564.2 pg/mL — ABNORMAL HIGH (ref 0.0–100.0)

## 2017-12-29 LAB — PROTEIN, URINE, RANDOM: Total Protein, Urine: 11 mg/dL

## 2017-12-29 NOTE — Progress Notes (Signed)
Pt is pretty much sleepy but easily arouse throughout the day, off/on, vitals maintained, Echo done, family members bed side and is updated, pt has a BM twice, eating moderate, denies pain, will continue to monitor the patient  Lonia Farber, RN

## 2017-12-29 NOTE — Progress Notes (Signed)
PROGRESS NOTE    Jack Zuniga  HQI:[id number] DOB: Nov 03, 1919 DOA: 12/27/2017 PCP: Devra Dopp, MD  Outpatient Specialists:   Brief Narrative:  Patient is a 84 year old male with past medical history significant for hypertension and stroke.  Patient was admitted with what appears to be flash pulmonary edema, and acute kidney injury versus acute kidney injury on chronic kidney disease.  With diuresis, patient's shortness of breath has improved significantly, but worsening renal function is noted.  Wheezing has resolved.  Troponin on presentation was 0.06.  No repeat troponin visualized.  EKG is noted.  Will repeat troponin.  We will pursue renal ultrasound.  Will have a low threshold to rule out renal artery stenosis.  Right lower leg/foot is cool to touch.  Will order arterial Doppler ultrasound of the lower extremities.  We will also repeat troponin.  No chest pain reported.  At some point, there will be need to define goal of care.  Patient refused echocardiogram earlier today.  Will have a low threshold to consult palliative care team.  Prognosis is guarded.  12/29/2017: Patient continues to improve.  Chest x-ray shows some improvement.  Renal ultrasound revealed BPH and renal cyst.  Continue to monitor renal function and electrolytes.  Continue IV Lasix for now.  Likely DC home tomorrow on oral diuretics.  Patient was seen alongside 3 daughters and son-in-law.   Assessment & Plan:   Principal Problem:   CHF exacerbation (HCC) Active Problems:   Essential hypertension   Constipation   Glaucoma     New-onset CHF exacerbation/flash pulmonary edema: -Patient with prior h/o respiratory failure presenting with SOB and hypoxia -CXR consistent with pulmonary edema -Normal WBC count -Elevated BNP -With elevated BNP and abnl CXR, new-onset CHF seems probable as diagnosis -Will admit with telemetry -Will request echocardiogram -Will start ASA -Will continue ARB -Will start low-dose  Coreg -CHF order set utilized; may need CHF team consult but will hold until Echo results are available -Was given Lasix 40 mg x 1 in ER and will repeat with 40 mg IV BID -Continue  O2 for now prn -Repeat EKG in AM  12/29/2017: Troponin has remained stable at 0.04.  Echocardiogram is still pending.  Chest x-ray shows improvement.  Doppler ultrasound of the lower extremities are still pending.  Possible discharge in the morning on oral diuretics.  Possible peripheral artery disease: Awaiting arterial Doppler ultrasound of lower extremities.  Acute kidney injury versus acute kidney injury of chronic kidney disease: Renal ultrasound result is noted. Check urine sodium, protein and electrolytes. Recent baseline serum creatinine is unknown.  Last BMP in our system was done in 2016 Continue to monitor renal function and electrolytes closely.  HTN -Continue Losartan -Add Coreg, as above Continue to monitor closely.  Constipation -Continue Colace, Miralax  Glaucoma -Continue Combigan and Trusopt  DVT prophylaxis:  Lovenox Code Status: DNR - confirmed with patient Family Communication: Disposition Plan:  Home once clinically improved Consults called: CM, PT.   Procedures:   Echocardiogram still pending.    Antimicrobials:   None   Subjective: Shortness of breath has resolved Wheezing has resolved. No chest pain.  Objective: Vitals:   12/29/17 0321 12/29/17 0423 12/29/17 0846 12/29/17 1557  BP:  111/66 110/70 (!) 106/54  Pulse:  76 74 96  Resp:  18 18 (!) 8  Temp:  98.4 F (36.9 C) 98 F (36.7 C) 97.9 F (36.6 C)  TempSrc:  Oral  Oral  SpO2:  100% 100% 93%  Weight: 86  kg     Height:        Intake/Output Summary (Last 24 hours) at 12/29/2017 1602 Last data filed at 12/29/2017 0220 Gross per 24 hour  Intake 240 ml  Output 800 ml  Net -560 ml   [employer]   12/27/17 1126 12/28/17 0412 12/29/17 0321  Weight: 89.5 kg 87 kg 86 kg     Examination:  General exam: Appears calm and comfortable.  Patient is pale. Respiratory system: Clear to auscultation. Respiratory effort normal. Cardiovascular system: S1 & S2 heard. No pedal edema. Gastrointestinal system: Abdomen is nondistended, soft and nontender. No organomegaly or masses felt. Normal bowel sounds heard. Central nervous system: Alert and oriented. No focal neurological deficits. Extremities: Symmetric 5 x 5 power.  No leg edema.   Data Reviewed: I have personally reviewed following labs and imaging studies  CBC: Recent Labs  Lab 12/27/17 0747 12/28/17 0008  WBC 5.8 4.6  NEUTROABS 4.1 2.5  HGB 11.2* 11.4*  HCT 37.8* 37.7*  MCV 79.1 77.9*  PLT 185 184   Basic Metabolic Panel: Recent Labs  Lab 12/27/17 0747 12/28/17 0008 12/29/17 0615  NA 143 142 142  K 4.4 4.0 3.5  CL 109 102 101  CO2 25 28 30   GLUCOSE 149* 99 121*  BUN 21 23 27*  CREATININE 1.39* 1.47* 1.52*  CALCIUM 9.6 9.7 9.6  MG  --   --  1.9  PHOS  --   --  3.8   GFR: Estimated Creatinine Clearance: 30.3 mL/min (A) (by C-G formula based on SCr of 1.52 mg/dL (H)). Liver Function Tests: Recent Labs  Lab 12/27/17 0747 12/29/17 0615  AST 55*  --   ALT 48*  --   ALKPHOS 42  --   BILITOT 0.7  --   PROT 7.4  --   ALBUMIN 3.2* 3.3*   No results for input(s): LIPASE, AMYLASE in the last 168 hours. No results for input(s): AMMONIA in the last 168 hours. Coagulation Profile: No results for input(s): INR, PROTIME in the last 168 hours. Cardiac Enzymes: Recent Labs  Lab 12/28/17 1700 12/28/17 1941 12/28/17 2244  TROPONINI 0.04* 0.04* 0.04*   BNP (last 3 results) No results for input(s): PROBNP in the last 8760 hours. HbA1C: No results for input(s): HGBA1C in the last 72 hours. CBG: No results for input(s): GLUCAP in the last 168 hours. Lipid Profile: No results for input(s): CHOL, HDL, LDLCALC, TRIG, CHOLHDL, LDLDIRECT in the last 72 hours. Thyroid Function Tests: No  results for input(s): TSH, T4TOTAL, FREET4, T3FREE, THYROIDAB in the last 72 hours. Anemia Panel: No results for input(s): VITAMINB12, FOLATE, FERRITIN, TIBC, IRON, RETICCTPCT in the last 72 hours. Urine analysis:    Component Value Date/Time   COLORURINE YELLOW 12/29/2017 0832   APPEARANCEUR HAZY (A) 12/29/2017 0832   LABSPEC 1.009 12/29/2017 0832   PHURINE 5.0 12/29/2017 0832   GLUCOSEU NEGATIVE 12/29/2017 0832   HGBUR SMALL (A) 12/29/2017 0832   BILIRUBINUR NEGATIVE 12/29/2017 0832   KETONESUR NEGATIVE 12/29/2017 0832   PROTEINUR NEGATIVE 12/29/2017 0832   UROBILINOGEN 1.0 01/16/2015 0214   NITRITE NEGATIVE 12/29/2017 0832   LEUKOCYTESUR LARGE (A) 12/29/2017 0832   Sepsis Labs: @LABRCNTIP (procalcitonin:4,lacticidven:4)  )No results found for this or any previous visit (from the past 240 hour(s)).       Radiology Studies: US Renal  Result Date: 12/28/2017 CLINICAL DATA:  Acute renal insufficiency. EXAM: RENAL / URINARY TRACT ULTRASOUND COMPLETE COMPARISON:  CT 01/17/2015 FINDINGS: Right Kidney: Length: 10.0 cm.  Echogenicity within normal limits. No mass or hydronephrosis visualized. Left Kidney: Length: 10.5 cm. Echogenicity within normal limits. 1.4 cm cyst over the mid pole. No mass or hydronephrosis visualized. Bladder: Appears normal for degree of bladder distention. Prostatic enlargement measuring 6.7 x 6.7 x 5.9 cm with impression upon the bladder base. IMPRESSION: Normal size kidneys without hydronephrosis. 1.4 cm left renal cyst. Prostatic enlargement. Electronically Signed   By: Elberta Fortis M.D.   On: 12/28/2017 21:32   Dg Chest Port 1 View  Result Date: 12/29/2017 CLINICAL DATA:  Congestive failure EXAM: PORTABLE CHEST 1 VIEW COMPARISON:  12/27/2017 FINDINGS: Cardiomegaly is again identified. The degree of vascular congestion interstitial edema has improved somewhat in the interval from the prior exam although not completely resolved. No focal infiltrate is seen. No  bony abnormality is noted. Gas is identified throughout the visualized bowel. IMPRESSION: Improved but not completely resolved CHF. Electronically Signed   By: Alcide Clever M.D.   On: 12/29/2017 13:54        Scheduled Meds: . aspirin EC  81 mg Oral Daily  . brimonidine  1 drop Both Eyes BID   And  . timolol  1 drop Both Eyes BID  . carvedilol  3.125 mg Oral BID WC  . docusate sodium  100 mg Oral BID  . dorzolamide  1 drop Both Eyes BID  . enoxaparin (LOVENOX) injection  40 mg Subcutaneous Q24H  . feeding supplement (ENSURE ENLIVE)  237 mL Oral Daily  . furosemide  40 mg Intravenous Q12H  . losartan  50 mg Oral Daily  . multivitamin with minerals  1 tablet Oral Daily  . polyethylene glycol  17 g Oral Daily  . sodium chloride flush  3 mL Intravenous Q12H   Continuous Infusions: . sodium chloride       LOS: 2 days    Time spent: 35 minutes    Berton Mount, MD  [facility] Pager #: [phone] 7PM-7AM contact night coverage as above

## 2017-12-29 NOTE — Progress Notes (Signed)
Pts. Family voiced concerns about pts. Noticeable confusion. States that this is new behavior for the pt.

## 2017-12-29 NOTE — Progress Notes (Signed)
  Echocardiogram 2D Echocardiogram has been performed.  Jack Zuniga 12/29/2017, 5:34 PM

## 2017-12-29 NOTE — Plan of Care (Signed)
  Problem: Activity: Goal: Risk for activity intolerance will decrease Outcome: Progressing   Problem: Elimination: Goal: Will not experience complications related to bowel motility Outcome: Progressing   

## 2017-12-30 ENCOUNTER — Inpatient Hospital Stay (HOSPITAL_COMMUNITY): Payer: Medicare PPO

## 2017-12-30 DIAGNOSIS — R609 Edema, unspecified: Secondary | ICD-10-CM

## 2017-12-30 DIAGNOSIS — I5041 Acute combined systolic (congestive) and diastolic (congestive) heart failure: Secondary | ICD-10-CM

## 2017-12-30 LAB — RENAL FUNCTION PANEL
Albumin: 3.1 g/dL — ABNORMAL LOW (ref 3.5–5.0)
Anion gap: 11 (ref 5–15)
BUN: 32 mg/dL — ABNORMAL HIGH (ref 8–23)
CO2: 29 mmol/L (ref 22–32)
Calcium: 9.3 mg/dL (ref 8.9–10.3)
Chloride: 102 mmol/L (ref 98–111)
Creatinine, Ser: 1.52 mg/dL — ABNORMAL HIGH (ref 0.61–1.24)
GFR calc Af Amer: 43 mL/min — ABNORMAL LOW (ref >60)
GFR calc non Af Amer: 37 mL/min — ABNORMAL LOW (ref >60)
Glucose, Bld: 105 mg/dL — ABNORMAL HIGH (ref 70–99)
Phosphorus: 3.9 mg/dL (ref 2.5–4.6)
Potassium: 3.8 mmol/L (ref 3.5–5.1)
Sodium: 142 mmol/L (ref 135–145)

## 2017-12-30 LAB — ECHOCARDIOGRAM COMPLETE
Height: 71 in
Weight: 3032 oz

## 2017-12-30 MED ORDER — MAGNESIUM HYDROXIDE 400 MG/5ML PO SUSP
15.0000 mL | Freq: Once | ORAL | Status: AC | PRN
  Administered 2017-12-30: 15 mL via ORAL
  Filled 2017-12-30: qty 30

## 2017-12-30 MED ORDER — LATANOPROST 0.005 % OP SOLN
1.0000 [drp] | Freq: Every day | OPHTHALMIC | Status: DC
  Administered 2017-12-30: 1 [drp] via OPHTHALMIC
  Filled 2017-12-30 (×2): qty 2.5

## 2017-12-30 MED ORDER — FUROSEMIDE 40 MG PO TABS
40.0000 mg | ORAL_TABLET | Freq: Every day | ORAL | Status: DC
  Administered 2017-12-31: 40 mg via ORAL
  Filled 2017-12-30: qty 1

## 2017-12-30 NOTE — Progress Notes (Signed)
PROGRESS NOTE    Jack Zuniga  JWJ:[id number] DOB: 10-21-1919 DOA: 12/27/2017 PCP: Devra Dopp, MD  Outpatient Specialists:   Brief Narrative:  Patient is a 84 year old male with past medical history significant for hypertension and stroke.  Patient was admitted with what appears to be flash pulmonary edema, and acute kidney injury versus acute kidney injury on chronic kidney disease.  With diuresis, patient's shortness of breath has improved significantly, but worsening renal function is noted.  Wheezing has resolved.  Troponin on presentation was 0.06.  No repeat troponin visualized.  EKG is noted.  Will repeat troponin.  We will pursue renal ultrasound.  Will have a low threshold to rule out renal artery stenosis.  Right lower leg/foot is cool to touch.  Will order arterial Doppler ultrasound of the lower extremities.  We will also repeat troponin.  No chest pain reported.  At some point, there will be need to define goal of care.  Patient refused echocardiogram earlier today.  Will have a low threshold to consult palliative care team.  Prognosis is guarded.  12/29/2017: Patient continues to improve.  Chest x-ray shows some improvement.  Renal ultrasound revealed BPH and renal cyst.  Continue to monitor renal function and electrolytes.  Continue IV Lasix for now.  Likely DC home tomorrow on oral diuretics.  Patient was seen alongside 3 daughters and son-in-law.  12/30/2017: Patient seen.  Discussed case with cardiology team and palliative care team.  Likely discharge home tomorrow as patient lives alone.  This management will assist with discharge needs.  Hopefully, palliative care team will also follow patient on discharge.  We discontinue IV Lasix.  Will start patient on oral Lasix 40 mg p.o. once daily tomorrow.  Continue to monitor renal function and electrolytes.  Discontinue oral, considering rising serum creatinine.  Patient is also marginally and intermittently hypotensive.  Echo findings  noted, EF of 15 to 20%, grade 2 diastolic dysfunction, moderate aortic regurgitation, moderate mitral regurgitation.  Cardiology input is highly appreciated-goal of care at the limits extent of work-up.  Medical management for now.  For ACE inhibitor or ARB renal function and blood pressure permits.  No complaints from the patient   Assessment & Plan:   Principal Problem:   CHF exacerbation (HCC) Active Problems:   Essential hypertension   Constipation   Glaucoma     New-onset CHF exacerbation/flash pulmonary edema: -Patient with prior h/o respiratory failure presenting with SOB and hypoxia -CXR consistent with pulmonary edema -Normal WBC count -Elevated BNP -With elevated BNP and abnl CXR, new-onset CHF seems probable as diagnosis -Will admit with telemetry -Will request echocardiogram -Will start ASA -Will continue ARB -Will start low-dose Coreg -CHF order set utilized; may need CHF team consult but will hold until Echo results are available -Was given Lasix 40 mg x 1 in ER and will repeat with 40 mg IV BID -Continue [place] O2 for now prn -Repeat EKG in AM  12/29/2017: Troponin has remained stable at 0.04.  Echocardiogram is still pending.  Chest x-ray shows improvement.  Doppler ultrasound of the lower extremities are still pending.  Possible discharge in the morning on oral diuretics. 12/30/2017: Kindly see above  Possible peripheral artery disease: Awaiting arterial Doppler ultrasound of lower extremities. Abnormal right ABI.  Goal of care limits extent of work-up. Palliative care consulted to follow patient on discharge.  Acute kidney injury versus acute kidney injury of chronic kidney disease: Renal ultrasound result is noted. Check urine sodium, protein and electrolytes. Recent baseline  serum creatinine is unknown.  Last BMP in our system was done in 2016 Continue to monitor renal function and electrolytes closely. Discontinue IV Lasix. Start oral Lasix  tomorrow  HTN -Continue Losartan -Add Coreg, as above Continue to monitor closely. Discontinue ARB  Constipation -Continue Colace, Miralax  Glaucoma -Continue Combigan and Trusopt  DVT prophylaxis:  Lovenox Code Status: DNR - confirmed with patient Family Communication: Disposition Plan:  Home once clinically improved Consults called: CM, PT.   Procedures:   Echocardiogram still pending.    Antimicrobials:   None   Subjective: Shortness of breath has resolved No chest pain.  Objective: Vitals:   12/30/17 0420 12/30/17 0800 12/30/17 0900 12/30/17 1243  BP: (!) 88/61 100/60 110/70 106/65  Pulse: 73  80 85  Resp: 18   20  Temp: 98.2 F (36.8 C)   98.3 F (36.8 C)  TempSrc: Oral   Oral  SpO2: 96%   92%  Weight: 85.9 kg     Height:        Intake/Output Summary (Last 24 hours) at 12/30/2017 1712 Last data filed at 12/30/2017 1100 Gross per 24 hour  Intake 240 ml  Output 1 ml  Net 239 ml   Filed Weights   12/28/17 0412 12/29/17 0321 12/30/17 0420  Weight: 87 kg 86 kg 85.9 kg    Examination:  General exam: Appears calm and comfortable.  Patient is pale. Respiratory system: Clear to auscultation. Respiratory effort normal. Cardiovascular system: S1 & S2 heard. No pedal edema. Gastrointestinal system: Abdomen is nondistended, soft and nontender. No organomegaly or masses felt. Normal bowel sounds heard. Central nervous system: Alert and oriented. No focal neurological deficits. Extremities: Symmetric 5 x 5 power.  No leg edema.   Data Reviewed: I have personally reviewed following labs and imaging studies  CBC: Recent Labs  Lab 12/27/17 0747 12/28/17 0008  WBC 5.8 4.6  NEUTROABS 4.1 2.5  HGB 11.2* 11.4*  HCT 37.8* 37.7*  MCV 79.1 77.9*  PLT 185 184   Basic Metabolic Panel: Recent Labs  Lab 12/27/17 0747 12/28/17 0008 12/29/17 0615 12/30/17 0907  NA 143 142 142 142  K 4.4 4.0 3.5 3.8  CL 109 102 101 102  CO2 25 28 30 29   GLUCOSE  149* 99 121* 105*  BUN 21 23 27* 32*  CREATININE 1.39* 1.47* 1.52* 1.52*  CALCIUM 9.6 9.7 9.6 9.3  MG  --   --  1.9  --   PHOS  --   --  3.8 3.9   GFR: Estimated Creatinine Clearance: 30.3 mL/min (A) (by C-G formula based on SCr of 1.52 mg/dL (H)). Liver Function Tests: Recent Labs  Lab 12/27/17 0747 12/29/17 0615 12/30/17 0907  AST 55*  --   --   ALT 48*  --   --   ALKPHOS 42  --   --   BILITOT 0.7  --   --   PROT 7.4  --   --   ALBUMIN 3.2* 3.3* 3.1*   No results for input(s): LIPASE, AMYLASE in the last 168 hours. No results for input(s): AMMONIA in the last 168 hours. Coagulation Profile: No results for input(s): INR, PROTIME in the last 168 hours. Cardiac Enzymes: Recent Labs  Lab 12/28/17 1700 12/28/17 1941 12/28/17 2244  TROPONINI 0.04* 0.04* 0.04*   BNP (last 3 results) No results for input(s): PROBNP in the last 8760 hours. HbA1C: No results for input(s): HGBA1C in the last 72 hours. CBG: No results for input(s): GLUCAP  in the last 168 hours. Lipid Profile: No results for input(s): CHOL, HDL, LDLCALC, TRIG, CHOLHDL, LDLDIRECT in the last 72 hours. Thyroid Function Tests: No results for input(s): TSH, T4TOTAL, FREET4, T3FREE, THYROIDAB in the last 72 hours. Anemia Panel: No results for input(s): VITAMINB12, FOLATE, FERRITIN, TIBC, IRON, RETICCTPCT in the last 72 hours. Urine analysis:    Component Value Date/Time   COLORURINE YELLOW 12/29/2017 0832   APPEARANCEUR HAZY (A) 12/29/2017 0832   LABSPEC 1.009 12/29/2017 0832   PHURINE 5.0 12/29/2017 0832   GLUCOSEU NEGATIVE 12/29/2017 0832   HGBUR SMALL (A) 12/29/2017 0832   BILIRUBINUR NEGATIVE 12/29/2017 0832   KETONESUR NEGATIVE 12/29/2017 0832   PROTEINUR NEGATIVE 12/29/2017 0832   UROBILINOGEN 1.0 01/16/2015 0214   NITRITE NEGATIVE 12/29/2017 0832   LEUKOCYTESUR LARGE (A) 12/29/2017 0832   Sepsis Labs: @LABRCNTIP (procalcitonin:4,lacticidven:4)  )No results found for this or any previous visit  (from the past 240 hour(s)).       Radiology Studies: US Renal  Result Date: 12/28/2017 CLINICAL DATA:  Acute renal insufficiency. EXAM: RENAL / URINARY TRACT ULTRASOUND COMPLETE COMPARISON:  CT 01/17/2015 FINDINGS: Right Kidney: Length: 10.0 cm. Echogenicity within normal limits. No mass or hydronephrosis visualized. Left Kidney: Length: 10.5 cm. Echogenicity within normal limits. 1.4 cm cyst over the mid pole. No mass or hydronephrosis visualized. Bladder: Appears normal for degree of bladder distention. Prostatic enlargement measuring 6.7 x 6.7 x 5.9 cm with impression upon the bladder base. IMPRESSION: Normal size kidneys without hydronephrosis. 1.4 cm left renal cyst. Prostatic enlargement. Electronically Signed   By: Elberta Fortis M.D.   On: 12/28/2017 21:32   Dg Chest Port 1 View  Result Date: 12/29/2017 CLINICAL DATA:  Congestive failure EXAM: PORTABLE CHEST 1 VIEW COMPARISON:  12/27/2017 FINDINGS: Cardiomegaly is again identified. The degree of vascular congestion interstitial edema has improved somewhat in the interval from the prior exam although not completely resolved. No focal infiltrate is seen. No bony abnormality is noted. Gas is identified throughout the visualized bowel. IMPRESSION: Improved but not completely resolved CHF. Electronically Signed   By: Alcide Clever M.D.   On: 12/29/2017 13:54        Scheduled Meds: . aspirin EC  81 mg Oral Daily  . carvedilol  3.125 mg Oral BID WC  . docusate sodium  100 mg Oral BID  . enoxaparin (LOVENOX) injection  40 mg Subcutaneous Q24H  . feeding supplement (ENSURE ENLIVE)  237 mL Oral Daily  . [START ON 12/31/2017] furosemide  40 mg Oral Daily  . latanoprost  1 drop Both Eyes QHS  . multivitamin with minerals  1 tablet Oral Daily  . polyethylene glycol  17 g Oral Daily  . sodium chloride flush  3 mL Intravenous Q12H   Continuous Infusions: . sodium chloride       LOS: 3 days    Time spent: 25 minutes    Berton Mount, MD  [facility] Pager #: [phone] 7PM-7AM contact night coverage as above

## 2017-12-30 NOTE — Progress Notes (Addendum)
VASCULAR LAB PRELIMINARY  ARTERIAL  ABI completed:    RIGHT    LEFT    PRESSURE WAVEFORM  PRESSURE WAVEFORM  BRACHIAL 112 Triphasic BRACHIAL 113 Triphasic  DP 73 Monophasic DP 112 Monophasic  PT 70 Biphasic PT 136 Biphasic  GREAT TOE 68 NA GREAT TOE 78 NA    RIGHT LEFT  ABI / TBI 0.65 / 0.60 1.20 / 0.69   Right ABI indicates a moderate reduction in arterial flow at rest TBI is abnormal. Left ABI indicates normal arterial flow at rest. TBI is abnormal  Rosana Farnell, RVS 12/30/2017, 4:03 PM

## 2017-12-30 NOTE — Consult Note (Signed)
Cardiology Consultation:   Patient ID: Jack Zuniga; [id number]; [birth year]   Admit date: 12/27/2017 Date of Consult: 12/30/2017  Primary Care Provider: Devra Dopp, MD Primary Cardiologist: none   Patient Profile:   Jack Zuniga is a 84 y.o. male with a hx of hypertension, CVA who is being seen today for the evaluation of heart failure at the request of Dr. Dartha Lodge.  History of Present Illness:   Jack Zuniga is giving his own history today, and we were unable to reach a family member by phone. He presented with acute shortness of breath. Initial concern was for flash pulmonary edema. However, his echo shows severe LV dysfunction. He denies prior history of heart failure to me. He is resting comfortably in bed, and he is asking when he can go home.  Denies chest pain, breathing is improving. No syncope. He personally has no other concerns at this time.  Past Medical History:  Diagnosis Date  . Hypertension   . Stroke [facility])    2011 in [place], no deficits    History reviewed. No pertinent surgical history.   Home Medications:  Prior to Admission medications   Medication Sig Start Date End Date Taking? Authorizing Provider  acetaminophen (TYLENOL) 325 MG tablet Take 650 mg by mouth every 6 (six) hours as needed (for pain.).   Yes [provider]  dorzolamide-timolol (COSOPT) 22.3-6.8 MG/ML ophthalmic solution Place 1 drop into both eyes 2 (two) times daily. 12/14/17  Yes [provider]  feeding supplement, ENSURE ENLIVE, (ENSURE ENLIVE) LIQD Take 237 mLs by mouth every morning. 01/20/15  Yes Rama, Maryruth Bun, MD  latanoprost (XALATAN) 0.005 % ophthalmic solution Place 1 drop into both eyes at bedtime. 12/14/17  Yes [provider]  losartan (COZAAR) 100 MG tablet Take 100 mg by mouth daily.    Yes [provider]  Multiple Vitamins-Minerals (ONE-A-DAY MENS 50+ ADVANTAGE) TABS Take 1 tablet by mouth daily.   Yes [provider]  Protein (PROCEL  PO) Take 2 scoop by mouth 2 (two) times daily.   Yes [provider]  COMBIGAN 0.2-0.5 % ophthalmic solution Place 1 drop into both eyes 2 (two) times daily. 12/24/14   [provider]  docusate sodium (COLACE) 100 MG capsule Take 1 capsule (100 mg total) by mouth 2 (two) times daily. Patient not taking: Reported on 12/28/2017 01/20/15   Rama, Maryruth Bun, MD  dorzolamide (TRUSOPT) 2 % ophthalmic solution Place 1 drop into both eyes 3 (three) times daily. 12/08/13   [provider]  polyethylene glycol (MIRALAX / GLYCOLAX) packet Take 17 g by mouth daily. Patient not taking: Reported on 12/28/2017 01/20/15   Rama, Maryruth Bun, MD    Inpatient Medications: Scheduled Meds: . aspirin EC  81 mg Oral Daily  . carvedilol  3.125 mg Oral BID WC  . docusate sodium  100 mg Oral BID  . enoxaparin (LOVENOX) injection  40 mg Subcutaneous Q24H  . feeding supplement (ENSURE ENLIVE)  237 mL Oral Daily  . furosemide  40 mg Intravenous Q12H  . latanoprost  1 drop Both Eyes QHS  . multivitamin with minerals  1 tablet Oral Daily  . polyethylene glycol  17 g Oral Daily  . sodium chloride flush  3 mL Intravenous Q12H   Continuous Infusions: . sodium chloride     PRN Meds: sodium chloride, acetaminophen, ondansetron (ZOFRAN) IV, sodium chloride flush  Allergies:   No Known Allergies  Social History:   Social History   Socioeconomic History  .  Marital status: Widowed    Spouse name: Not on file  . Number of children: Not on file  . Years of education: Not on file  . Highest education level: Not on file  Occupational History  . Occupation: retired  Engineer, production  . Financial resource strain: Not on file  . Food insecurity:    Worry: Not on file    Inability: Not on file  . Transportation needs:    Medical: Not on file    Non-medical: Not on file  Tobacco Use  . Smoking status: Former Smoker    Years: 30.00    Types: Cigars  . Smokeless tobacco: Never Used  Substance  and Sexual Activity  . Alcohol use: No  . Drug use: No  . Sexual activity: Not on file  Lifestyle  . Physical activity:    Days per week: Not on file    Minutes per session: Not on file  . Stress: Not on file  Relationships  . Social connections:    Talks on phone: Not on file    Gets together: Not on file    Attends religious service: Not on file    Active member of club or organization: Not on file    Attends meetings of clubs or organizations: Not on file    Relationship status: Not on file  . Intimate partner violence:    Fear of current or ex partner: Not on file    Emotionally abused: Not on file    Physically abused: Not on file    Forced sexual activity: Not on file  Other Topics Concern  . Not on file  Social History Narrative  . Not on file    Family History:    Family History  Problem Relation Age of Onset  . Stroke Neg Hx   . Kidney disease Neg Hx      ROS:  Please see the history of present illness.  Review of Systems  Constitution: Negative for fever and weight loss.  HENT: Positive for hearing loss. Negative for ear pain.   Eyes: Negative for blurred vision and pain.  Cardiovascular: Positive for dyspnea on exertion. Negative for chest pain, irregular heartbeat, palpitations, paroxysmal nocturnal dyspnea and syncope.  Respiratory: Positive for cough and shortness of breath. Negative for sputum production.   Hematologic/Lymphatic: Does not bruise/bleed easily.  Skin: Negative for rash.  Musculoskeletal: Negative for falls and myalgias.  Gastrointestinal: Positive for constipation. Negative for hematochezia.  Genitourinary: Positive for frequency. Negative for hematuria.  Neurological: Negative for focal weakness and loss of balance.    All other ROS reviewed and negative.     Physical Exam/Data:   Vitals:   12/30/17 0420 12/30/17 0800 12/30/17 0900 12/30/17 1243  BP: (!) 88/61 100/60 110/70 106/65  Pulse: 73  80 85  Resp: 18   20  Temp: 98.2 F  (36.8 C)   98.3 F (36.8 C)  TempSrc: Oral   Oral  SpO2: 96%   92%  Weight: 85.9 kg     Height:        Intake/Output Summary (Last 24 hours) at 12/30/2017 1406 Last data filed at 12/30/2017 1100 Gross per 24 hour  Intake 240 ml  Output 801 ml  Net -561 ml   Filed Weights   12/28/17 0412 12/29/17 0321 12/30/17 0420  Weight: 87 kg 86 kg 85.9 kg   Body mass index is 26.41 kg/m.  General:  Well nourished, well developed, in no acute distress HEENT: normal  Lymph: no adenopathy Neck: JVD just above clavicle at 30 degrees Endocrine:  No thryomegaly Vascular: No carotid bruits; DP and radial pulses 2+ bilaterally Cardiac:  regular S1, S2; no murmur, rubs or gallops Lungs:  clear to auscultation bilaterally, minimal decrease at bilateral bases Abd: soft, nontender, bowel sounds normal Ext: trace LE edema Musculoskeletal:  No deformities, moves all limbs independently Skin: warm and dry  Neuro:  no focal abnormalities noted Psych:  Normal affect   EKG:  The EKG was personally reviewed and demonstrates:  Sinus tach, LAE, LVH Telemetry:  Telemetry was personally reviewed and demonstrates:  Sinus rhythm  Relevant CV Studies: Echo personally read: Left ventricle: The cavity size was moderately to severely   dilated. Systolic function was severely reduced. The estimated   ejection fraction was in the range of 15% to 20%. Diffuse   hypokinesis. Regional wall motion abnormalities cannot be   excluded. Features are consistent with a pseudonormal left   ventricular filling pattern, with concomitant abnormal relaxation   and increased filling pressure (grade 2 diastolic dysfunction). - Aortic valve: There was mild to moderate regurgitation. - Mitral valve: There was moderate regurgitation. - Left atrium: The atrium was moderately dilated. - Right ventricle: Systolic function was normal. - Tricuspid valve: There was trivial regurgitation. - Pulmonic valve: There was trivial  regurgitation.  Impressions:  - Dilated LV, with severely reduced LV function and global   hypokinesis. Degree of dysfunction precludes accurate evaluation   of regional wall motion abnormalities. Aortic valve thickened but   visually opens well, no significant stenosis, mild-moderate AR.   Moderate MR.  Laboratory Data:  Chemistry Recent Labs  Lab 12/28/17 0008 12/29/17 0615 12/30/17 0907  NA 142 142 142  K 4.0 3.5 3.8  CL 102 101 102  CO2 28 30 29   GLUCOSE 99 121* 105*  BUN 23 27* 32*  CREATININE 1.47* 1.52* 1.52*  CALCIUM 9.7 9.6 9.3  GFRNONAA 38* 37* 37*  GFRAA 45* 43* 43*  ANIONGAP 12 11 11     Recent Labs  Lab 12/27/17 0747 12/29/17 0615 12/30/17 0907  PROT 7.4  --   --   ALBUMIN 3.2* 3.3* 3.1*  AST 55*  --   --   ALT 48*  --   --   ALKPHOS 42  --   --   BILITOT 0.7  --   --    Hematology Recent Labs  Lab 12/27/17 0747 12/28/17 0008  WBC 5.8 4.6  RBC 4.78 4.84  HGB 11.2* 11.4*  HCT 37.8* 37.7*  MCV 79.1 77.9*  MCH 23.4* 23.6*  MCHC 29.6* 30.2  RDW 16.7* 16.3*  PLT 185 184   Cardiac Enzymes Recent Labs  Lab 12/28/17 1700 12/28/17 1941 12/28/17 2244  TROPONINI 0.04* 0.04* 0.04*    Recent Labs  Lab 12/27/17 0750  TROPIPOC 0.06    BNP Recent Labs  Lab 12/27/17 0747 12/29/17 0615  BNP 702.8* 564.2*    DDimer No results for input(s): DDIMER in the last 168 hours.  Radiology/Studies:  Dg Chest 2 View  Result Date: 12/27/2017 CLINICAL DATA:  Shortness of breath, cough EXAM: CHEST - 2 VIEW COMPARISON:  01/19/2015 FINDINGS: Cardiomegaly. Diffuse interstitial and alveolar opacities throughout the lungs most compatible with edema. Probable small effusions. No acute bony abnormality. IMPRESSION: Cardiomegaly with diffuse interstitial and alveolar opacities, likely edema/CHF. Small effusions. Electronically Signed   By: Charlett Nose M.D.   On: 12/27/2017 08:31   US Renal  Result Date: 12/28/2017 CLINICAL DATA:  Acute renal insufficiency.  EXAM: RENAL / URINARY TRACT ULTRASOUND COMPLETE COMPARISON:  CT 01/17/2015 FINDINGS: Right Kidney: Length: 10.0 cm. Echogenicity within normal limits. No mass or hydronephrosis visualized. Left Kidney: Length: 10.5 cm. Echogenicity within normal limits. 1.4 cm cyst over the mid pole. No mass or hydronephrosis visualized. Bladder: Appears normal for degree of bladder distention. Prostatic enlargement measuring 6.7 x 6.7 x 5.9 cm with impression upon the bladder base. IMPRESSION: Normal size kidneys without hydronephrosis. 1.4 cm left renal cyst. Prostatic enlargement. Electronically Signed   By: Elberta Fortis M.D.   On: 12/28/2017 21:32   Dg Chest Port 1 View  Result Date: 12/29/2017 CLINICAL DATA:  Congestive failure EXAM: PORTABLE CHEST 1 VIEW COMPARISON:  12/27/2017 FINDINGS: Cardiomegaly is again identified. The degree of vascular congestion interstitial edema has improved somewhat in the interval from the prior exam although not completely resolved. No focal infiltrate is seen. No bony abnormality is noted. Gas is identified throughout the visualized bowel. IMPRESSION: Improved but not completely resolved CHF. Electronically Signed   By: Alcide Clever M.D.   On: 12/29/2017 13:54    Assessment and Plan:   New systolic and diastolic heart failure, acute vs. Acute on chronic -He appears fairly well compensated at this point. I discussed heart failure with him, but I am not sure how much he retained with me. He tried to call family but couldn't reach them by phone.  -he is on carvedilol and furosemide currently. Cr is 1.52, unclear what most recent baseline is, but ACEI or ARB would be next medication -he is DNR and able to tell me that he doesn't want invasive workup and wants to go home. I am unsure what has been discussed before, and unfortunately family not present on my interview.  -I'd recommend ok for discharge with follow up in the office to discuss medication titration and further workup. Given  his age and what he has expressed to me about his wishes, likely will forgo usual right and left heart cath/ischemic workup and pursue medical management. -unclear etiology, very dilated chamber, may be due to longstanding hypertension vs. Other cause. No known ischemic history.  [facility] will sign off in anticipation of discharge.   Medication Recommendations:  Continue carvedilol and furosemide Other recommendations (labs, testing, etc):  BMET within a week Follow up as an outpatient:  We will get him follow up with cardiology.  Time Spent Directly with Patient: I have spent a total of 80 minutes with the patient reviewing hospital notes, telemetry, EKGs, labs and examining the patient as well as establishing an assessment and plan that was discussed personally with the patient.  > 50% of time was spent in direct patient care.  Length of Stay:  LOS: 3 days   Jodelle Red, MD, PhD [facility]  [facility]   12/30/2017, 2:06 PM  For questions or updates, please contact [facility] Please consult [email or web address] for contact info under Cardiology/STEMI.

## 2017-12-30 NOTE — Progress Notes (Signed)
Pt is constipated. Says the Colace is not effective. Reports he uses milk of magnesia at home as needed for constipation. NP on call was notified. Will continue to monitor.

## 2017-12-30 NOTE — Progress Notes (Signed)
Pt is pretty much sleepy on an doff but easily arouse throughout the day, off/on, vitals maintained, family members bed side and is updated, eating moderate, denies pain, will continue to monitor the patient  Lonia Farber, RN

## 2017-12-31 DIAGNOSIS — N189 Chronic kidney disease, unspecified: Secondary | ICD-10-CM

## 2017-12-31 LAB — RENAL FUNCTION PANEL
Albumin: 3 g/dL — ABNORMAL LOW (ref 3.5–5.0)
Anion gap: 12 (ref 5–15)
BUN: 32 mg/dL — ABNORMAL HIGH (ref 8–23)
CO2: 30 mmol/L (ref 22–32)
Calcium: 9.3 mg/dL (ref 8.9–10.3)
Chloride: 102 mmol/L (ref 98–111)
Creatinine, Ser: 1.43 mg/dL — ABNORMAL HIGH (ref 0.61–1.24)
GFR calc Af Amer: 46 mL/min — ABNORMAL LOW (ref >60)
GFR calc non Af Amer: 40 mL/min — ABNORMAL LOW (ref >60)
Glucose, Bld: 97 mg/dL (ref 70–99)
Phosphorus: 3.2 mg/dL (ref 2.5–4.6)
Potassium: 3 mmol/L — ABNORMAL LOW (ref 3.5–5.1)
Sodium: 144 mmol/L (ref 135–145)

## 2017-12-31 LAB — MAGNESIUM: Magnesium: 2.2 mg/dL (ref 1.7–2.4)

## 2017-12-31 MED ORDER — CARVEDILOL 3.125 MG PO TABS: 3.1250 mg | ORAL_TABLET | Freq: Two times a day (BID) | ORAL | 0 refills | Status: DC

## 2017-12-31 MED ORDER — POTASSIUM CHLORIDE ER 10 MEQ PO TBCR: 10.0000 meq | EXTENDED_RELEASE_TABLET | Freq: Every day | ORAL | 0 refills | Status: DC

## 2017-12-31 MED ORDER — ASPIRIN 81 MG PO TBEC: 81.0000 mg | DELAYED_RELEASE_TABLET | Freq: Every day | ORAL | 0 refills | Status: AC

## 2017-12-31 MED ORDER — FUROSEMIDE 40 MG PO TABS: 40.0000 mg | ORAL_TABLET | Freq: Every day | ORAL | 0 refills | Status: DC

## 2017-12-31 MED ORDER — POTASSIUM CHLORIDE CRYS ER 20 MEQ PO TBCR
40.0000 meq | EXTENDED_RELEASE_TABLET | Freq: Once | ORAL | Status: AC
  Administered 2017-12-31: 40 meq via ORAL
  Filled 2017-12-31: qty 2

## 2017-12-31 MED ORDER — ASPIRIN 81 MG PO TBEC: 81.0000 mg | DELAYED_RELEASE_TABLET | Freq: Every day | ORAL | 0 refills | Status: DC

## 2017-12-31 MED FILL — FUROSEMIDE 40 MG TABLET: 40 | 30 days supply | Qty: 30 | Fill #0

## 2017-12-31 MED FILL — POTASSIUM CL ER 10 MEQ TAB: 10 | 7 days supply | Qty: 7 | Fill #0

## 2017-12-31 MED FILL — CARVEDILOL 3.125 MG TABLET: 3.125 | 30 days supply | Qty: 60 | Fill #0

## 2017-12-31 MED FILL — ASPIRIN LOW DOSE 81 MG TBEC: 81 | 30 days supply | Qty: 30 | Fill #0

## 2017-12-31 NOTE — Progress Notes (Addendum)
Patient attempted to call patient's daughter [name] for discharge planning; VM left, awaiting for call back. Alexis Goodell [phone]  11:06 am- Received call back from patient's daughter to discuss [facility] choices; they will be arranging for someone to stay in the home with the pt at discharge; [name] request the Attending MD talk to the son Antwione [phone], text message sent to MD; Alexis Goodell [phone]  11:25 am- Received call from [name], she requested [facility] for [facility] services; Dan with [facility] called for arrangements; B Jarmal Lewelling RN  11:45 am - CM called [name] concerning discharge home today; she will transport the patient home later this afternoon. Abelino Derrick [facility] [phone]

## 2017-12-31 NOTE — Progress Notes (Signed)
PT Cancellation Note  Patient Details Name: Jack Zuniga MRN: [id number] DOB: February 04, 1920   Cancelled Treatment:    Reason Eval/Treat Not Completed: Patient declined, no reason specified Patient politely declining PT services today. States he is being discharged and wishes to rest/stay in room until his family arrives. PT to continue to follow in the event patient remains at [facility].   Kipp Laurence, PT, DPT Supplemental Physical Therapist 12/31/17 2:49 PM Pager: [phone] Office: [phone]

## 2017-12-31 NOTE — Discharge Summary (Signed)
Physician Discharge Summary  Patient ID: Jack Zuniga MRN: [id number] DOB/AGE: 1919/11/24 84 y.o.  Admit date: 12/27/2017 Discharge date: 12/31/2017  Admission Diagnoses:  Discharge Diagnoses:  Principal Problem:   Acute combined systolic and diastolic CHF Active Problems:   Cardiomyopathy with an EF of 15%.      Likely peripheral artery disease of right lower extremity.    Acute kidney injury versus acute kidney injury on chronic kidney disease (baseline serum creatinine is unknown)    Essential hypertension    Constipation    Glaucoma    Hypokalemia following diuretic therapy (monitor closely)   Discharged Condition: stable  Hospital Course: Patient is a 84 year old male with past medical history significant for hypertension and stroke.  Patient was admitted with sudden onset of shortness of breath, wheezing, and acute kidney injury versus acute kidney injury on chronic kidney disease (uncertain baseline serum creatinine).    Troponin on presentation was 0.06.  Repeat troponin is stable, at 0.04.  Patient was admitted for further assessment and management.  Patient was started on IV Lasix 40 mg twice daily.  With diuresis, patient's shortness of breath and wheezing improved significantly, but worsening renal function was noted.  Initially, patient refused echocardiogram.  Echocardiogram was eventually done after extensive explanation to the patient and patient's family.  Echo report is as documented above.  Renal impairment was worked up.  Renal ultrasound revealed BPH.  Patient's renal function is improving with discontinuation of high-dose IV diuretics, and commencement of oral diuretics.  Primary care provider will kindly continue to monitor renal function and electrolytes.  Potassium was 3 today.  Potassium will be repleted prior to discharge.  Patient will be discharged on oral potassium supplement.  Will be need to repeat BMP in 2 days.  Further management will be as per patient's primary  care provider.  During the hospital course, the cardiology team was consulted.  Patient was seen by Dr. Cristal Deer.  After extensive discussion with the cardiology team, the patient preferred to be managed conservatively.  Currently, patient is DO NOT RESUSCITATE.  Will have palliative care team follow patient on discharge.  Long-term prognosis is guarded.  Patient will also be discharged with home health PT, nursing and nursing aide.  Patient will follow with the primary care provider and cardiology team on discharge.  Patient has been optimized.    Initial evaluation of the patient also revealed that the right lower extremity was cool to touch.  Arterial Doppler ultrasound with ABI was ordered.  Patient prefers not to pursue aggressive management for now.  Patient also denied history of calf pain on ambulation.  Will defer further management to the primary care provider.    New-onset CHF, acute, combined systolic and diastolic dysfunction: Kindly see above. CXRwas consistent with pulmonary edema Normal WBC count Patient will be discharged on Lasix 40 mg p.o. once daily, Coreg 12.125 mg p.o. twice daily. -When renal function permits, and systolic blood pressure permits, consider adding ACE inhibitor or angiotensin II receptor blocker. -Continue to monitor renal function and electrolytes on discharge.   Possible peripheral artery disease: Goal of care limits extent of work-up. Palliative care consulted to follow patient on discharge.  Acute kidney injury versus acute kidney injury of chronic kidney disease: Renal ultrasound result is noted. Renal function is improving with conversion of IV Lasix to oral Lasix. Continue to monitor renal function and electrolytes.  Hypertension:  Currently, patient's blood pressures on the low side. Continue to monitor blood pressure closely on  discharge. Will avoid dropping patient's blood pressures so low at patient's age.   Low normal blood pressure  likely secondary to low EF of 15%.    Constipation -Continue Colace, Miralax  Glaucoma -Continue Combigan and Trusopt  Consults: cardiology  Significant Diagnostic Studies:   Echocardiogram: " Left ventricle: The cavity size was moderately to severely   dilated. Systolic function was severely reduced. The estimated   ejection fraction was in the range of 15% to 20%. Diffuse   hypokinesis. Regional wall motion abnormalities cannot be   excluded. Features are consistent with a pseudonormal left   ventricular filling pattern, with concomitant abnormal relaxation   and increased filling pressure (grade 2 diastolic dysfunction). - Aortic valve: There was mild to moderate regurgitation. - Mitral valve: There was moderate regurgitation. - Left atrium: The atrium was moderately dilated. - Right ventricle: Systolic function was normal. - Tricuspid valve: There was trivial regurgitation. - Pulmonic valve: There was trivial regurgitation.  Impressions:  - Dilated LV, with severely reduced LV function and global   hypokinesis. Degree of dysfunction precludes accurate evaluation   of regional wall motion abnormalities. Aortic valve thickened but   visually opens well, no significant stenosis, mild-moderate AR.   Moderate MR".   Discharge Exam: Blood pressure 127/60, pulse 87, temperature 97.9 F (36.6 C), temperature source Oral, resp. rate 18, height 5\' 11"  (1.803 m), weight 83.5 kg, SpO2 96 %.   Disposition: Discharge disposition: 06-Home-Health Care Svc   Discharge Instructions    Diet - low sodium heart healthy   Complete by:  As directed    Increase activity slowly   Complete by:  As directed      Allergies as of 12/31/2017   No Known Allergies     Medication List    STOP taking these medications   COMBIGAN 0.2-0.5 % ophthalmic solution Generic drug:  brimonidine-timolol   dorzolamide 2 % ophthalmic solution Commonly known as:  TRUSOPT   dorzolamide-timolol  22.3-6.8 MG/ML ophthalmic solution Commonly known as:  COSOPT   losartan 100 MG tablet Commonly known as:  COZAAR     TAKE these medications   acetaminophen 325 MG tablet Commonly known as:  TYLENOL Take 650 mg by mouth every 6 (six) hours as needed (for pain.).   aspirin 81 MG EC tablet Take 1 tablet (81 mg total) by mouth daily. Start taking on:  01/01/2018   carvedilol 3.125 MG tablet Commonly known as:  COREG Take 1 tablet (3.125 mg total) by mouth 2 (two) times daily with a meal.   docusate sodium 100 MG capsule Commonly known as:  COLACE Take 1 capsule (100 mg total) by mouth 2 (two) times daily.   feeding supplement (ENSURE ENLIVE) Liqd Take 237 mLs by mouth every morning.   furosemide 40 MG tablet Commonly known as:  LASIX Take 1 tablet (40 mg total) by mouth daily. Start taking on:  01/01/2018   latanoprost 0.005 % ophthalmic solution Commonly known as:  XALATAN Place 1 drop into both eyes at bedtime.   ONE-A-DAY MENS 50+ ADVANTAGE Tabs Take 1 tablet by mouth daily.   polyethylene glycol packet Commonly known as:  MIRALAX / GLYCOLAX Take 17 g by mouth daily.   potassium chloride 10 MEQ tablet Commonly known as:  K-DUR Take 1 tablet (10 mEq total) by mouth daily for 7 days.   PROCEL PO Take 2 scoop by mouth 2 (two) times daily.      Time spent discharging patient: 30 minutes.  Signed:  Barnetta Chapel 12/31/2017, 12:08 PM

## 2017-12-31 NOTE — Progress Notes (Signed)
Palliative Medicine RN Note: Consult order noted. PMT NP attempted to set up family meeting yesterday but was unable to do so. PMT provider will reach out to family to set up appt when there is availability.  Margret Chance Nethaniel Mattie, RN, BSN, [facility] Palliative Medicine Team 12/31/2017 2:36 PM Office [phone]

## 2017-12-31 NOTE — Care Management Important Message (Signed)
Important Message  Patient Details  Name: Jack Zuniga MRN: [id number] Date of Birth: April 28, 1919   Medicare Important Message Given:  Yes    Khady Vandenberg P Kam Kushnir 12/31/2017, 2:17 PM

## 2018-01-11 ENCOUNTER — Ambulatory Visit: Payer: Medicare PPO | Admitting: Internal Medicine

## 2018-01-14 ENCOUNTER — Ambulatory Visit: Payer: Medicare PPO | Admitting: Cardiology

## 2018-01-14 ENCOUNTER — Encounter: Payer: Self-pay | Admitting: Cardiology

## 2018-01-14 VITALS — BP 118/68 | HR 88 | Ht 71.0 in | Wt 194.6 lb

## 2018-01-14 DIAGNOSIS — I10 Essential (primary) hypertension: Secondary | ICD-10-CM

## 2018-01-14 DIAGNOSIS — I248 Other forms of acute ischemic heart disease: Secondary | ICD-10-CM

## 2018-01-14 DIAGNOSIS — I5043 Acute on chronic combined systolic (congestive) and diastolic (congestive) heart failure: Secondary | ICD-10-CM | POA: Diagnosis not present

## 2018-01-14 DIAGNOSIS — I340 Nonrheumatic mitral (valve) insufficiency: Secondary | ICD-10-CM

## 2018-01-14 MED ORDER — CARVEDILOL 6.25 MG PO TABS: 6.2500 mg | ORAL_TABLET | Freq: Two times a day (BID) | ORAL | 11 refills | Status: DC

## 2018-01-14 NOTE — Progress Notes (Signed)
Cardiology Office Note:    Date:  01/14/2018   ID:  Jack Zuniga, DOB 06-29-1919, MRN [id number]  PCP:  Jack Dopp, MD  Cardiologist:  Jack Red, MD  Electrophysiologist:  None   Referring MD: Jack Dopp, MD     History of Present Illness:    Jack Zuniga is a 84 y.o. male here for the follow up hospital stay for acute systolic and diastolic heart failure, cardiomyopathy with EF of 15%.  He was seen in consultation by Dr. Cristal Zuniga of heart care on 12/30/2017 in the hospital setting.  He presented to the hospital with acute shortness of breath with concerns of flash bony edema however echo was noteworthy for severe LV dysfunction.  He was resting comfortably and asking if he could go home at the time of consultation.  Denied any significant chest pain.  Breathing was improved.  Prior stroke in 2011.  He was given carvedilol, IV Lasix. Off of losartan 50 mg.  Overall he has been feeling quite well.  He has gained 2 pounds since his hospitalization.  His daughters are here with him.  He denies any chest pain fevers chills nausea vomiting syncope.  No orthopnea.  He lays comfortably in bed.  No racing heart rates, no unexplained syncope.  Past Medical History:  Diagnosis Date  . Cataracts, both eyes   . CHF (congestive heart failure) (HCC)   . Constipation   . CVA (cerebral vascular accident) (HCC)   . Glaucoma   . Hypertension   . Hypokalemia   . Kidney injury   . Stroke [facility])    2011 in [place], no deficits    History reviewed. No pertinent surgical history.  Current Medications: Current Meds  Medication Sig  . acetaminophen (TYLENOL) 325 MG tablet Take 650 mg by mouth every 6 (six) hours as needed (for pain.).  Marland Kitchen aspirin 81 MG EC tablet Take 1 tablet (81 mg total) by mouth daily.  . carvedilol (COREG) 6.25 MG tablet Take 1 tablet (6.25 mg total) by mouth 2 (two) times daily with a meal.  . docusate sodium (COLACE) 100 MG capsule Take 1 capsule (100 mg total) by  mouth 2 (two) times daily.  . feeding supplement, ENSURE ENLIVE, (ENSURE ENLIVE) LIQD Take 237 mLs by mouth every morning.  . furosemide (LASIX) 40 MG tablet Take 1 tablet (40 mg total) by mouth daily.  Marland Kitchen latanoprost (XALATAN) 0.005 % ophthalmic solution Place 1 drop into both eyes at bedtime.  . Multiple Vitamins-Minerals (ONE-A-DAY MENS 50+ ADVANTAGE) TABS Take 1 tablet by mouth daily.  . polyethylene glycol (MIRALAX / GLYCOLAX) packet Take 17 g by mouth daily.  . [DISCONTINUED] carvedilol (COREG) 3.125 MG tablet Take 1 tablet (3.125 mg total) by mouth 2 (two) times daily with a meal.     Allergies:   Patient has no known allergies.   Social History   Socioeconomic History  . Marital status: Widowed    Spouse name: Not on file  . Number of children: Not on file  . Years of education: Not on file  . Highest education level: Not on file  Occupational History  . Occupation: retired  Engineer, production  . Financial resource strain: Not on file  . Food insecurity:    Worry: Not on file    Inability: Not on file  . Transportation needs:    Medical: Not on file    Non-medical: Not on file  Tobacco Use  . Smoking status: Former Smoker    Years:  30.00    Types: Cigars  . Smokeless tobacco: Never Used  Substance and Sexual Activity  . Alcohol use: No  . Drug use: No  . Sexual activity: Not on file  Lifestyle  . Physical activity:    Days per week: Not on file    Minutes per session: Not on file  . Stress: Not on file  Relationships  . Social connections:    Talks on phone: Not on file    Gets together: Not on file    Attends religious service: Not on file    Active member of club or organization: Not on file    Attends meetings of clubs or organizations: Not on file    Relationship status: Not on file  Other Topics Concern  . Not on file  Social History Narrative  . Not on file     Family History: The patient's family history is negative for Stroke and Kidney  disease.  ROS:   Please see the history of present illness.     All other systems reviewed and are negative.  EKGs/Labs/Other Studies Reviewed:    The following studies were reviewed today:  Echo 12/2017: Left ventricle: The cavity size was moderately to severely dilated. Systolic function was severely reduced. The estimated ejection fraction was in the range of 15% to 20%. Diffuse hypokinesis. Regional wall motion abnormalities cannot be excluded. Features are consistent with a pseudonormal left ventricular filling pattern, with concomitant abnormal relaxation and increased filling pressure (grade 2 diastolic dysfunction). - Aortic valve: There was mild to moderate regurgitation. - Mitral valve: There was moderate regurgitation. - Left atrium: The atrium was moderately dilated. - Right ventricle: Systolic function was normal. - Tricuspid valve: There was trivial regurgitation. - Pulmonic valve: There was trivial regurgitation.  Impressions:  - Dilated LV, with severely reduced LV function and global hypokinesis. Degree of dysfunction precludes accurate evaluation of regional wall motion abnormalities. Aortic valve thickened but visually opens well, no significant stenosis, mild-moderate AR. Moderate MR.  EKG: 12/28/2017-sinus rhythm LVH repolarization abnormality, T wave changes noted.  Recent Labs: 12/27/2017: ALT 48 12/28/2017: Hemoglobin 11.4; Platelets 184 12/29/2017: B Natriuretic Peptide 564.2 12/31/2017: BUN 32; Creatinine, Ser 1.43; Magnesium 2.2; Potassium 3.0; Sodium 144  Recent Lipid Panel No results found for: CHOL, TRIG, HDL, CHOLHDL, VLDL, LDLCALC, LDLDIRECT  Physical Exam:    VS:  BP 118/68   Pulse 88   Ht 5\' 11"  (1.803 m)   Wt 194 lb 9.6 oz (88.3 kg)   BMI 27.14 kg/m     Wt Readings from Last 3 Encounters:  01/14/18 194 lb 9.6 oz (88.3 kg)  12/31/17 184 lb (83.5 kg)  02/05/15 207 lb 3.2 oz (94 kg)     GEN: Elderly well  nourished, well developed in no acute distress HEENT: Normal NECK: No JVD; No carotid bruits LYMPHATICS: No lymphadenopathy CARDIAC: RRR, 1/6 SM apex, no rubs, gallops RESPIRATORY:  Clear to auscultation without rales, wheezing or rhonchi  ABDOMEN: Soft, non-tender, non-distended MUSCULOSKELETAL:  No edema; No deformity  SKIN: Warm and dry NEUROLOGIC:  Alert and oriented x 3 PSYCHIATRIC:  Normal affect   ASSESSMENT:    1. Acute on chronic combined systolic and diastolic CHF (congestive heart failure) (HCC)   2. Essential hypertension   3. Demand ischemia (HCC)   4. Non-rheumatic mitral regurgitation    PLAN:    In order of problems listed above:  Acute systolic diastolic heart failure - In the hospital setting was felt to be  fairly well compensated.,  Creatinine was in the 1.5 range.  He was able to express with Dr. Cristal Zuniga that he was likely not interested in invasive evaluation.  Could be long-standing hypertension, agree with Dr. Cristal Zuniga.  We discussed once again that we do not know the absolute etiology behind this but we will continue to treat medically.  I stated that I would not encourage an invasive work-up for him given his advanced age and risks and family completely agrees.  No cardiac catheterization. -We will increase his carvedilol from 3.125 twice a day to 6.25 mg twice a day. -If weight increases 2 to 3 pounds, he may take Lasix 40 mg extra tablet. -We will continue to hold his losartan 50 mg.  Last creatinine 1.5 range. -Original BNP 564.  Current weight 194.6.  Moderate mitral regurgitation - Likely functional in the setting of dilated left ventricle.  Treat underlying left heart disease.   Essential hypertension - Currently well controlled on carvedilol.  Elevated troponin -Previously 0.04, flat, demand ischemia in the setting of his heart failure.  This does portend a worsened prognosis.  In 2 months, we will have him follow-up with Dr.  Cristal Zuniga.   Medication Adjustments/Labs and Tests Ordered: Current medicines are reviewed at length with the patient today.  Concerns regarding medicines are outlined above.  No orders of the defined types were placed in this encounter.  Meds ordered this encounter  Medications  . carvedilol (COREG) 6.25 MG tablet    Sig: Take 1 tablet (6.25 mg total) by mouth 2 (two) times daily with a meal.    Dispense:  60 tablet    Refill:  11    Patient Instructions  Medication Instructions:  Please increase your Carvedilol  to 6.25 mg one tablet twice a day. You may take an extra Furosemide if weight increases 2 to 3 lbs over night. Continue all other medications as listed.  Follow-Up: Follow up with Dr Jack Zuniga in 2 months.  Any Other Special Instructions Will Be Listed Below (If Applicable). Daily weights - keep diary  Fluid restriction - 1 to 1 1/2 liters per day.  If you need a refill on your cardiac medications before your next appointment, please call your pharmacy.  Thank you for choosing [facility]!!        Signed, Donato Schultz, MD  01/14/2018 9:33 AM    [facility]

## 2018-01-14 NOTE — Patient Instructions (Signed)
Medication Instructions:  Please increase your Carvedilol  to 6.25 mg one tablet twice a day. You may take an extra Furosemide if weight increases 2 to 3 lbs over night. Continue all other medications as listed.  Follow-Up: Follow up with Dr Cristal Deer in 2 months.  Any Other Special Instructions Will Be Listed Below (If Applicable). Daily weights - keep diary  Fluid restriction - 1 to 1 1/2 liters per day.  If you need a refill on your cardiac medications before your next appointment, please call your pharmacy.  Thank you for choosing [facility]!!

## 2018-01-25 ENCOUNTER — Telehealth: Payer: Self-pay | Admitting: Cardiology

## 2018-01-25 MED ORDER — FUROSEMIDE 40 MG PO TABS: 40.0000 mg | ORAL_TABLET | Freq: Every day | ORAL | 5 refills | Status: DC

## 2018-01-25 NOTE — Telephone Encounter (Signed)
Refill for Lasix sent to pt pharmacy as requested  Lasix 40mg  daily #30 R-5

## 2018-01-25 NOTE — Telephone Encounter (Signed)
New Message          *STAT* If patient is at the pharmacy, call can be transferred to refill team.   1. Which medications need to be refilled? (please list name of each medication and dose if known) Lasix  2. Which pharmacy/location (including street and city if local pharmacy) is medication to be sent to?[facility] [address]  3. Do they need a 30 day or 90 day supply? 30  Dr. Cristal Deer saw patient in the hospital, patient's daughter was told to call Dr. Di Kindle office for the refill

## 2018-03-22 ENCOUNTER — Encounter: Payer: Self-pay | Admitting: Cardiology

## 2018-03-22 ENCOUNTER — Ambulatory Visit: Payer: Medicare PPO | Admitting: Cardiology

## 2018-03-22 VITALS — BP 129/74 | HR 88 | Wt 182.8 lb

## 2018-03-22 DIAGNOSIS — I5042 Chronic combined systolic (congestive) and diastolic (congestive) heart failure: Secondary | ICD-10-CM | POA: Diagnosis not present

## 2018-03-22 DIAGNOSIS — I10 Essential (primary) hypertension: Secondary | ICD-10-CM

## 2018-03-22 DIAGNOSIS — Z79899 Other long term (current) drug therapy: Secondary | ICD-10-CM

## 2018-03-22 DIAGNOSIS — Z7189 Other specified counseling: Secondary | ICD-10-CM | POA: Diagnosis not present

## 2018-03-22 LAB — BASIC METABOLIC PANEL
BUN / CREAT RATIO: 18 (ref 10–24)
BUN: 23 mg/dL (ref 10–36)
CO2: 26 mmol/L (ref 20–29)
Calcium: 10.4 mg/dL — ABNORMAL HIGH (ref 8.6–10.2)
Chloride: 102 mmol/L (ref 96–106)
Creatinine, Ser: 1.31 mg/dL — ABNORMAL HIGH (ref 0.76–1.27)
GFR calc Af Amer: 53 mL/min/{1.73_m2} — ABNORMAL LOW (ref >59)
GFR calc non Af Amer: 46 mL/min/{1.73_m2} — ABNORMAL LOW (ref >59)
GLUCOSE: 83 mg/dL (ref 65–99)
POTASSIUM: 4.1 mmol/L (ref 3.5–5.2)
SODIUM: 145 mmol/L — AB (ref 134–144)

## 2018-03-22 LAB — LIPID PANEL
Chol/HDL Ratio: 3 ratio (ref 0.0–5.0)
Cholesterol, Total: 162 mg/dL (ref 100–199)
HDL: 54 mg/dL (ref >39)
LDL Calculated: 96 mg/dL (ref 0–99)
Triglycerides: 60 mg/dL (ref 0–149)
VLDL CHOLESTEROL CAL: 12 mg/dL (ref 5–40)

## 2018-03-22 NOTE — Patient Instructions (Addendum)
Medication Instructions:  Your physician recommends that you continue on your current medications as directed. Please refer to the Current Medication list given to you today.  If you need a refill on your cardiac medications before your next appointment, please call your pharmacy.   Lab work: Lipid and Bmet today If you have labs (blood work) drawn today and your tests are completely normal, you will receive your results only by: Marland Kitchen MyChart Message (if you have MyChart) OR . A paper copy in the mail If you have any lab test that is abnormal or we need to change your treatment, we will call you to review the results.  Testing/Procedures: Your physician has requested that you have an echocardiogram. Echocardiography is a painless test that uses sound waves to create images of your heart. It provides your doctor with information about the size and shape of your heart and how well your heart's chambers and valves are working. This procedure takes approximately one hour. There are no restrictions for this procedure.    Follow-Up: At [facility], you and your health needs are our priority.  As part of our continuing mission to provide you with exceptional heart care, we have created designated Provider Care Teams.  These Care Teams include your primary Cardiologist (physician) and Advanced Practice Providers (APPs -  Physician Assistants and Nurse Practitioners) who all work together to provide you with the care you need, when you need it. You will need a follow up appointment in 3 months with Dr.Tawyna Pellot.   your designated Care Team:   Theodore Demark, PA-C . Joni Reining, DNP, ANP  Any Other Special Instructions Will Be Listed Below (If Applicable).  Do the following things EVERY DAY:  1) Weigh yourself EVERY morning after you go to the bathroom but before you eat or drink anything. Write this number down in a weight log/diary. If you gain 3 pounds overnight or 5 pounds in a week, call  the office.  2) Take your medicines as prescribed. If you have concerns about your medications, please call us before you stop taking them.   3) Eat low salt foods-Limit salt (sodium) to 2000 mg per day. This will help prevent your body from holding onto fluid. Read food labels as many processed foods have a lot of sodium, especially canned goods and prepackaged meats. If you would like some assistance choosing low sodium foods, we would be happy to set you up with a nutritionist.  4) Stay as active as you can everyday. Staying active will give you more energy and make your muscles stronger. Start with 5 minutes at a time and work your way up to 30 minutes a day. Break up your activities--do some in the morning and some in the afternoon. Start with 3 days per week and work your way up to 5 days as you can.  If you have chest pain, feel short of breath, dizzy, or lightheaded, STOP. If you don't feel better after a short rest, call 911. If you do feel better, call the office to let us know you have symptoms with exercise.  5) Limit all fluids for the day to less than 2 liters. Fluid includes all drinks, coffee, juice, ice chips, soup, jello, and all other liquids.  -Echocardiogram to check function of the heart -Blood tests to check kidney function and cholesterol -If kidney function is ok, we will start a low dose of another medication to protect the heart. Once we start this pill, you will  need to come back in one week to recheck kidney function again.

## 2018-03-22 NOTE — Progress Notes (Signed)
Cardiology Office Note:    Date:  03/22/2018   ID:  Jack Zuniga, DOB 08-04-1919, MRN [id number]  PCP:  Helane Rima, MD  Cardiologist:  Buford Dresser, MD PhD  Referring MD: Helane Rima, MD   CC: follow up  History of Present Illness:    Jack Zuniga is a 84 y.o. 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 This was a new diagnosis for him when I met him in the hospital 12/2017. Etiology unclear, declines invasive workup. He was seen by Dr. Marlou Porch for post hospital follow up on 01/14/18.  He is here with two daughters today. Concerns today: -drinks a lot of ensure, at least one per day. Drinks a lot of water as well, about 1/2 gallon per day. Reviewed HF recommendations -181 lbs few days ago, has been stable.  -minimal activity, does walk with walker  Overall denies chest pain, shortness of breath, PND, orthopnea, LE edema, syncope, falls, palpitations. We had a long discussion about plans for long term management, aggressiveness of treatment, etc detailed below. Family all in line with same goals.  Past Medical History:  Diagnosis Date  . Cataracts, both eyes   . CHF (congestive heart failure) ([place])   . Constipation   . CVA (cerebral vascular accident) ([place])   . Glaucoma   . Hypertension   . Hypokalemia   . Kidney injury   . Stroke [facility])    2011 in [place], no deficits    No past surgical history on file.  Current Medications: Current Outpatient Medications on File Prior to Visit  Medication Sig  . acetaminophen (TYLENOL) 325 MG tablet Take 650 mg by mouth every 6 (six) hours as needed (for pain.).  Marland Kitchen aspirin 81 MG EC tablet Take 1 tablet (81 mg total) by mouth daily.  . carvedilol (COREG) 6.25 MG tablet Take 1 tablet (6.25 mg total) by mouth 2 (two) times daily with a meal.  . docusate sodium (COLACE) 100 MG  capsule Take 1 capsule (100 mg total) by mouth 2 (two) times daily.  . feeding supplement, ENSURE ENLIVE, (ENSURE ENLIVE) LIQD Take 237 mLs by mouth every morning.  . furosemide (LASIX) 40 MG tablet Take 1 tablet (40 mg total) by mouth daily.  Marland Kitchen latanoprost (XALATAN) 0.005 % ophthalmic solution Place 1 drop into both eyes at bedtime.  . Multiple Vitamins-Minerals (ONE-A-DAY MENS 50+ ADVANTAGE) TABS Take 1 tablet by mouth daily.  . polyethylene glycol (MIRALAX / GLYCOLAX) packet Take 17 g by mouth daily.   No current facility-administered medications on file prior to visit.      Allergies:   Patient has no known allergies.   Social History   Socioeconomic History  . Marital status: Widowed    Spouse name: Not on file  . Number of children: Not on file  . Years of education: Not on file  . Highest education level: Not on file  Occupational History  . Occupation: retired  Scientific laboratory technician  . Financial resource strain: Not on file  . Food insecurity:    Worry: Not on file    Inability: Not on file  . Transportation needs:    Medical: Not on file    Non-medical: Not on file  Tobacco Use  . Smoking status: Former Smoker    Years: 30.00    Types: Cigars  . Smokeless tobacco: Never  Used  Substance and Sexual Activity  . Alcohol use: No  . Drug use: No  . Sexual activity: Not on file  Lifestyle  . Physical activity:    Days per week: Not on file    Minutes per session: Not on file  . Stress: Not on file  Relationships  . Social connections:    Talks on phone: Not on file    Gets together: Not on file    Attends religious service: Not on file    Active member of club or organization: Not on file    Attends meetings of clubs or organizations: Not on file    Relationship status: Not on file  Other Topics Concern  . Not on file  Social History Narrative  . Not on file     Family History: The patient's family history is negative for Stroke and Kidney disease.  ROS:     Please see the history of present illness.  Additional pertinent ROS:  Constitutional: Negative for chills, fever, night sweats, unintentional weight loss  HENT: Negative for ear pain and hearing loss.   Eyes: Negative for loss of vision and eye pain.  Respiratory: Negative for cough, sputum, shortness of breath, wheezing.   Cardiovascular: Negative for chest pain, palpitations, PND, orthopnea, lower extremity edema and claudication.  Gastrointestinal: Negative for abdominal pain, melena, and hematochezia.  Genitourinary: Negative for dysuria and hematuria.  Musculoskeletal: Negative for falls and myalgias.  Skin: Negative for itching and rash.  Neurological: Negative for focal weakness, focal sensory changes and loss of consciousness.  Endo/Heme/Allergies: Does not bruise/bleed easily.    EKGs/Labs/Other Studies Reviewed:    The following studies were reviewed today: Echo 12/2017: Left ventricle: The cavity size was moderately to severely dilated. Systolic function was severely reduced. The estimated ejection fraction was in the range of 15% to 20%. Diffuse hypokinesis. Regional wall motion abnormalities cannot be excluded. Features are consistent with a pseudonormal left ventricular filling pattern, with concomitant abnormal relaxation and increased filling pressure (grade 2 diastolic dysfunction). - Aortic valve: There was mild to moderate regurgitation. - Mitral valve: There was moderate regurgitation. - Left atrium: The atrium was moderately dilated. - Right ventricle: Systolic function was normal. - Tricuspid valve: There was trivial regurgitation. - Pulmonic valve: There was trivial regurgitation.  Impressions:  - Dilated LV, with severely reduced LV function and global hypokinesis. Degree of dysfunction precludes accurate evaluation of regional wall motion abnormalities. Aortic valve thickened but visually opens well, no significant stenosis,  mild-moderate AR. Moderate MR.  EKG:  EKG is personally reviewed.  The ekg ordered demo9/13/19 nstrates sinus tach at 101, LAE, LVH.  Recent Labs: 12/27/2017: ALT 48 12/28/2017: Hemoglobin 11.4; Platelets 184 12/29/2017: B Natriuretic Peptide 564.2 12/31/2017: BUN 32; Creatinine, Ser 1.43; Magnesium 2.2; Potassium 3.0; Sodium 144  Recent Lipid Panel No results found for: CHOL, TRIG, HDL, CHOLHDL, VLDL, LDLCALC, LDLDIRECT  Physical Exam:    VS:  BP 129/74   Pulse 88   Wt 182 lb 12.8 oz (82.9 kg)   BMI 25.50 kg/m     Wt Readings from Last 3 Encounters:  03/22/18 182 lb 12.8 oz (82.9 kg)  01/14/18 194 lb 9.6 oz (88.3 kg)  12/31/17 184 lb (83.5 kg)     GEN: Well nourished, well developed in no acute distress HEENT: Normal NECK: No JVD; No carotid bruits LYMPHATICS: No lymphadenopathy CARDIAC: regular rhythm, normal S1 and S2, no rubs, gallops. 1/6 HSM at [place]. Radial and DP pulses 2+ bilaterally.  RESPIRATORY:  Clear to auscultation without rales, wheezing or rhonchi  ABDOMEN: Soft, non-tender, non-distended MUSCULOSKELETAL:  No edema; No deformity  SKIN: Warm and dry NEUROLOGIC:  Alert, no gross focal defects PSYCHIATRIC:  Normal affect   ASSESSMENT:    No diagnosis found. PLAN:    1. Chronic (new onset 06/9765) systolic and diastolic heart failure:  -declines invasive workup, including cardiac cath  -continue carvedilol  -reviewed heart failure education and lasix dosing. Weight is downtrending.   -holding ACEi/ARB/MRA given Cr 1.5. Will recheck kidney function today. If Cr stable, will start lisinopril and have him come back in 1 week for follow up blood test to monitor kidney function/potassium  -as his BP control is improved and he has been on carvedilol x3 mos, will recheck echocardiogram  -given his weight loss and nutritional needs, reviewed that Ensure is fine (not taking in excessively) as long as they watch his total fluid intake.   -unclear if this is ischemic or  nonischemic. Was started on aspirin. No available lipids. Will check, though utility of statin given his age is unclear.   2. Moderate MR: secondary to dilated ventricle  3. Hypertension: control much improved, <130/80. No syncope or falls. Continue carvedilol. Do not want to drop too much, if ACEi started will use low dose to avoid hypotension.  4. Goals of care: we discussed the personal preferences of the patient and his family at length. They do not want invasive/aggressive workup and instead want to focus on him feeling well with whatever time he may have left. They are interested in discussing DNR with his primary care and expressed to me today that this is what they all want.   Plan for follow up: 3 mos or sooner PRN  TIME SPENT WITH PATIENT: >25 minutes of direct patient care. More than 50% of that time was spent on coordination of care and counseling regarding .heart failure education and management, goals of care.  Buford Dresser, MD, PhD [place]  [facility]   Medication Adjustments/Labs and Tests Ordered: Current medicines are reviewed at length with the patient today.  Concerns regarding medicines are outlined above.  Orders Placed This Encounter  Procedures  . Lipid panel  . Basic metabolic panel  . ECHOCARDIOGRAM COMPLETE   No orders of the defined types were placed in this encounter.   Patient Instructions  Medication Instructions:  Your physician recommends that you continue on your current medications as directed. Please refer to the Current Medication list given to you today.  If you need a refill on your cardiac medications before your next appointment, please call your pharmacy.   Lab work: Lipid and Bmet today If you have labs (blood work) drawn today and your tests are completely normal, you will receive your results only by: Marland Kitchen MyChart Message (if you have MyChart) OR . A paper copy in the mail If you have any lab test that is abnormal or we  need to change your treatment, we will call you to review the results.  Testing/Procedures: Your physician has requested that you have an echocardiogram. Echocardiography is a painless test that uses sound waves to create images of your heart. It provides your doctor with information about the size and shape of your heart and how well your heart's chambers and valves are working. This procedure takes approximately one hour. There are no restrictions for this procedure.    Follow-Up: At [facility], you and your health needs are our priority.  As part  of our continuing mission to provide you with exceptional heart care, we have created designated Provider Care Teams.  These Care Teams include your primary Cardiologist (physician) and Advanced Practice Providers (APPs -  Physician Assistants and Nurse Practitioners) who all work together to provide you with the care you need, when you need it. You will need a follow up appointment in 3 months with Dr.Bernadene Garside.   your designated Care Team:   Rosaria Ferries, PA-C . Jory Sims, DNP, ANP  Any Other Special Instructions Will Be Listed Below (If Applicable).  Do the following things EVERY DAY:  1) Weigh yourself EVERY morning after you go to the bathroom but before you eat or drink anything. Write this number down in a weight log/diary. If you gain 3 pounds overnight or 5 pounds in a week, call the office.  2) Take your medicines as prescribed. If you have concerns about your medications, please call us before you stop taking them.   3) Eat low salt foods-Limit salt (sodium) to 2000 mg per day. This will help prevent your body from holding onto fluid. Read food labels as many processed foods have a lot of sodium, especially canned goods and prepackaged meats. If you would like some assistance choosing low sodium foods, we would be happy to set you up with a nutritionist.  4) Stay as active as you can everyday. Staying active will give you  more energy and make your muscles stronger. Start with 5 minutes at a time and work your way up to 30 minutes a day. Break up your activities--do some in the morning and some in the afternoon. Start with 3 days per week and work your way up to 5 days as you can.  If you have chest pain, feel short of breath, dizzy, or lightheaded, STOP. If you don't feel better after a short rest, call 911. If you do feel better, call the office to let us know you have symptoms with exercise.  5) Limit all fluids for the day to less than 2 liters. Fluid includes all drinks, coffee, juice, ice chips, soup, jello, and all other liquids.  -Echocardiogram to check function of the heart -Blood tests to check kidney function and cholesterol -If kidney function is ok, we will start a low dose of another medication to protect the heart. Once we start this pill, you will need to come back in one week to recheck kidney function again.    Signed, Buford Dresser, MD PhD 03/22/2018 7:28 AM    [place]

## 2018-03-24 ENCOUNTER — Encounter: Payer: Self-pay | Admitting: Cardiology

## 2018-03-24 DIAGNOSIS — I5042 Chronic combined systolic (congestive) and diastolic (congestive) heart failure: Secondary | ICD-10-CM | POA: Insufficient documentation

## 2018-04-01 ENCOUNTER — Other Ambulatory Visit: Payer: Self-pay

## 2018-04-01 DIAGNOSIS — Z79899 Other long term (current) drug therapy: Secondary | ICD-10-CM

## 2018-04-01 MED ORDER — LISINOPRIL 5 MG PO TABS: 5.0000 mg | ORAL_TABLET | Freq: Every day | ORAL | 3 refills | Status: DC

## 2018-04-12 ENCOUNTER — Other Ambulatory Visit: Payer: Self-pay

## 2018-04-12 DIAGNOSIS — Z79899 Other long term (current) drug therapy: Secondary | ICD-10-CM

## 2018-04-12 DIAGNOSIS — I5042 Chronic combined systolic (congestive) and diastolic (congestive) heart failure: Secondary | ICD-10-CM

## 2018-04-12 LAB — BASIC METABOLIC PANEL
BUN / CREAT RATIO: 16 (ref 10–24)
BUN: 21 mg/dL (ref 10–36)
CALCIUM: 10.4 mg/dL — AB (ref 8.6–10.2)
CHLORIDE: 103 mmol/L (ref 96–106)
CO2: 26 mmol/L (ref 20–29)
Creatinine, Ser: 1.28 mg/dL — ABNORMAL HIGH (ref 0.76–1.27)
GFR, EST AFRICAN AMERICAN: 54 mL/min/{1.73_m2} — AB (ref >59)
GFR, EST NON AFRICAN AMERICAN: 47 mL/min/{1.73_m2} — AB (ref >59)
Glucose: 86 mg/dL (ref 65–99)
POTASSIUM: 4.6 mmol/L (ref 3.5–5.2)
SODIUM: 144 mmol/L (ref 134–144)

## 2018-04-15 ENCOUNTER — Encounter: Payer: Self-pay | Admitting: *Deleted

## 2018-05-03 ENCOUNTER — Ambulatory Visit (HOSPITAL_COMMUNITY): Payer: Medicare PPO | Attending: Cardiovascular Disease

## 2018-05-03 ENCOUNTER — Other Ambulatory Visit: Payer: Self-pay

## 2018-05-03 DIAGNOSIS — I5042 Chronic combined systolic (congestive) and diastolic (congestive) heart failure: Secondary | ICD-10-CM | POA: Diagnosis not present

## 2018-05-03 MED ORDER — PERFLUTREN LIPID MICROSPHERE
1.0000 mL | INTRAVENOUS | Status: AC | PRN
  Administered 2018-05-03: 2 mL via INTRAVENOUS

## 2018-06-28 ENCOUNTER — Ambulatory Visit: Payer: Medicare PPO | Admitting: Cardiology

## 2018-07-05 ENCOUNTER — Other Ambulatory Visit: Payer: Self-pay | Admitting: Cardiology

## 2018-10-14 ENCOUNTER — Telehealth: Payer: Self-pay | Admitting: Cardiology

## 2018-10-14 NOTE — Telephone Encounter (Signed)
Daughter called today to report an 11 pound weight loss in the patient. She states he is still in good spirits, but his appetite isn't as strong as it used to be. Daughter does not want to bring her dad in for an appointment unless absolutely necessary due to [place].

## 2018-10-14 NOTE — Telephone Encounter (Signed)
Left message to call back  

## 2018-10-15 NOTE — Telephone Encounter (Signed)
Left message for pt dtr to call  

## 2018-10-15 NOTE — Telephone Encounter (Signed)
  Daughter is returning call 

## 2018-10-15 NOTE — Telephone Encounter (Signed)
Attempted to contact daughter to make aware of mychart response from Dr. Harrell Gave on 6/29. Unable to leave message as phone continue to ring.   Thank you for reaching out. That is a lot of weight to lose in three weeks. I would recommend reaching out to his primary care doctor to see if there are any tests they would recommend. They may be able to help with appetite too. Usually if it is the heart it is more an issue with weight gain instead of weight loss. Please let me know if he has any chest pain or shortness of breath. I'm glad to hear he is otherwise doing well. Please let me know how else I can help. Dr. Harrell Gave

## 2018-10-15 NOTE — Telephone Encounter (Signed)
Spoke with pt dtr, she was wanting to make sure her father loosing 11 pounds was not a concern. She reports he feels fine with no complaints and his bp is running good. Advised daughter to watch for low bp due to the weight loss and current medications. Aware will forward to dr Harrell Gave to review and advise.

## 2018-10-17 NOTE — Telephone Encounter (Signed)
Daughter updated and voiced understanding.  

## 2019-01-15 ENCOUNTER — Other Ambulatory Visit: Payer: Self-pay | Admitting: Cardiology

## 2019-01-17 ENCOUNTER — Other Ambulatory Visit: Payer: Self-pay

## 2019-01-17 ENCOUNTER — Encounter: Payer: Self-pay | Admitting: Cardiology

## 2019-01-17 ENCOUNTER — Ambulatory Visit: Payer: Medicare PPO | Admitting: Cardiology

## 2019-01-17 VITALS — BP 102/67 | HR 88 | Ht 71.0 in | Wt 167.0 lb

## 2019-01-17 DIAGNOSIS — I340 Nonrheumatic mitral (valve) insufficiency: Secondary | ICD-10-CM

## 2019-01-17 DIAGNOSIS — I5042 Chronic combined systolic (congestive) and diastolic (congestive) heart failure: Secondary | ICD-10-CM | POA: Diagnosis not present

## 2019-01-17 DIAGNOSIS — Z79899 Other long term (current) drug therapy: Secondary | ICD-10-CM | POA: Diagnosis not present

## 2019-01-17 DIAGNOSIS — R634 Abnormal weight loss: Secondary | ICD-10-CM

## 2019-01-17 DIAGNOSIS — I10 Essential (primary) hypertension: Secondary | ICD-10-CM | POA: Diagnosis not present

## 2019-01-17 MED ORDER — LISINOPRIL 5 MG PO TABS: 5.0000 mg | ORAL_TABLET | Freq: Every day | ORAL | 3 refills | Status: AC

## 2019-01-17 MED ORDER — CARVEDILOL 6.25 MG PO TABS: 6.2500 mg | ORAL_TABLET | Freq: Two times a day (BID) | ORAL | 3 refills | Status: AC

## 2019-01-17 MED ORDER — FUROSEMIDE 40 MG PO TABS: 40.0000 mg | ORAL_TABLET | Freq: Every day | ORAL | 3 refills | Status: AC

## 2019-01-17 NOTE — Patient Instructions (Addendum)

## 2019-01-17 NOTE — Progress Notes (Signed)
Cardiology Office Note:    Date:  01/17/2019   ID:  Jack Zuniga, DOB April 05, 1920, MRN [id number]  PCP:  Helane Rima, MD  Cardiologist:  Buford Dresser, MD PhD  Referring MD: Helane Rima, MD   CC: follow up  History of Present Illness:    Jack Zuniga is a 85 y.o. 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 This was a new diagnosis for him when I met him in the hospital 12/2017. Etiology unclear, declines invasive workup. He was seen by Dr. Marlou Porch for post hospital follow up on 01/14/18.  Today: Here with his daughter. Doing well overall. Does feel that he has heartburn intermittently, usually later in the afternoon when he is watching TV. Takes a Tums, works the best. Was given a heartburn pill from his PCP every day. Was taking the medication after eating, not sure what the pill is but discussed that often it is better to take 30-60 minutes before a meal if it is an acid reducer. Appetite is fair, has lost 27 lbs since 12/2017 based on our scale. Daughter thinks the number is similar on home scale. No melena/hematochezia, no hematuria. Drinks about 1 ensure/day over the course of the day.   Doesn't go outside, no stairs in the house. Walks around the house but not very active. Has BP cuff at home but doesn't have log today.   Denies chest pain, shortness of breath at rest or with normal exertion. No PND, orthopnea, LE edema or unexpected weight gain. No syncope or palpitations.  Past Medical History:  Diagnosis Date  . Cataracts, both eyes   . CHF (congestive heart failure) ([place])   . Constipation   . CVA (cerebral vascular accident) ([place])   . Glaucoma   . Hypertension   . Hypokalemia   . Kidney injury   . Stroke [facility])    2011 in [place], no deficits    No past surgical history on file.  Current Medications: Current  Outpatient Medications on File Prior to Visit  Medication Sig  . acetaminophen (TYLENOL) 325 MG tablet Take 650 mg by mouth every 6 (six) hours as needed (for pain.).  Marland Kitchen aspirin 81 MG EC tablet Take 1 tablet (81 mg total) by mouth daily.  . carvedilol (COREG) 6.25 MG tablet Take 1 tablet (6.25 mg total) by mouth 2 (two) times daily with a meal.  . docusate sodium (COLACE) 100 MG capsule Take 1 capsule (100 mg total) by mouth 2 (two) times daily.  . feeding supplement, ENSURE ENLIVE, (ENSURE ENLIVE) LIQD Take 237 mLs by mouth every morning.  . furosemide (LASIX) 40 MG tablet TAKE 1 TABLET BY MOUTH EVERY DAY  . latanoprost (XALATAN) 0.005 % ophthalmic solution Place 1 drop into both eyes at bedtime.  Marland Kitchen lisinopril (PRINIVIL,ZESTRIL) 5 MG tablet Take 1 tablet (5 mg total) by mouth daily.  . Multiple Vitamins-Minerals (ONE-A-DAY MENS 50+ ADVANTAGE) TABS Take 1 tablet by mouth daily.   No current facility-administered medications on file prior to visit.      Allergies:   Patient has no known allergies.   Social History   Socioeconomic History  . Marital status: Widowed    Spouse name: Not on file  . Number of children: Not on file  . Years of education: Not on file  . Highest education level: Not on file  Occupational  History  . Occupation: retired  Scientific laboratory technician  . Financial resource strain: Not on file  . Food insecurity    Worry: Not on file    Inability: Not on file  . Transportation needs    Medical: Not on file    Non-medical: Not on file  Tobacco Use  . Smoking status: Former Smoker    Years: 30.00    Types: Cigars  . Smokeless tobacco: Never Used  Substance and Sexual Activity  . Alcohol use: No  . Drug use: No  . Sexual activity: Not on file  Lifestyle  . Physical activity    Days per week: Not on file    Minutes per session: Not on file  . Stress: Not on file  Relationships  . Social Herbalist on phone: Not on file    Gets together: Not on file     Attends religious service: Not on file    Active member of club or organization: Not on file    Attends meetings of clubs or organizations: Not on file    Relationship status: Not on file  Other Topics Concern  . Not on file  Social History Narrative  . Not on file     Family History: The patient's family history is negative for Stroke and Kidney disease.  ROS:   Please see the history of present illness.  Additional pertinent ROS: Constitutional: Negative for chills, fever, night sweats,  Positive for unintentional weight loss  HENT: Negative for ear pain and hearing loss.   Eyes: Negative for loss of vision and eye pain.  Respiratory: Negative for cough, sputum, wheezing.   Cardiovascular: See HPI. Gastrointestinal: Negative for abdominal pain, melena, and hematochezia.  Genitourinary: Negative for dysuria and hematuria.  Musculoskeletal: Negative for falls and myalgias.  Skin: Negative for itching and rash.  Neurological: Negative for focal weakness, focal sensory changes and loss of consciousness.  Endo/Heme/Allergies: Does not bruise/bleed easily.    EKGs/Labs/Other Studies Reviewed:    The following studies were reviewed today: Echo 12/2017: Left ventricle: The cavity size was moderately to severely dilated. Systolic function was severely reduced. The estimated ejection fraction was in the range of 15% to 20%. Diffuse hypokinesis. Regional wall motion abnormalities cannot be excluded. Features are consistent with a pseudonormal left ventricular filling pattern, with concomitant abnormal relaxation and increased filling pressure (grade 2 diastolic dysfunction). - Aortic valve: There was mild to moderate regurgitation. - Mitral valve: There was moderate regurgitation. - Left atrium: The atrium was moderately dilated. - Right ventricle: Systolic function was normal. - Tricuspid valve: There was trivial regurgitation. - Pulmonic valve: There was trivial  regurgitation.  Impressions:  - Dilated LV, with severely reduced LV function and global hypokinesis. Degree of dysfunction precludes accurate evaluation of regional wall motion abnormalities. Aortic valve thickened but visually opens well, no significant stenosis, mild-moderate AR. Moderate MR.  EKG:  EKG is personally reviewed.  The ekg ordered today demonstrates NSR, LVH.  Recent Labs: 04/12/2018: BUN 21; Creatinine, Ser 1.28; Potassium 4.6; Sodium 144  Recent Lipid Panel    Component Value Date/Time   CHOL 162 03/22/2018 1021   TRIG 60 03/22/2018 1021   HDL 54 03/22/2018 1021   CHOLHDL 3.0 03/22/2018 1021   LDLCALC 96 03/22/2018 1021    Physical Exam:    VS:  BP 102/67   Pulse 88   Ht _0  (1.803 m)   Wt 167 lb (75.8 kg)   SpO2 99%  BMI 23.29 kg/m     Wt Readings from Last 3 Encounters:  01/17/19 167 lb (75.8 kg)  03/22/18 182 lb 12.8 oz (82.9 kg)  01/14/18 194 lb 9.6 oz (88.3 kg)    GEN: thin but in no acute distress HEENT: Normal, moist mucous membranes NECK: No JVD CARDIAC: regular rhythm, normal S1 and S2, no rubs, gallops. 1/6 HSM at LSB VASCULAR: Radial and DP pulses 2+ bilaterally. No carotid bruits RESPIRATORY:  Clear to auscultation without rales, wheezing or rhonchi  ABDOMEN: Soft, non-tender, non-distended MUSCULOSKELETAL:  Ambulates independently SKIN: Warm and dry, no edema NEUROLOGIC:  Alert and oriented x 3. No focal neuro deficits noted. PSYCHIATRIC:  Normal affect   ASSESSMENT:    1. Chronic combined systolic and diastolic congestive heart failure ([place])   2. Non-rheumatic mitral regurgitation   3. Essential hypertension   4. Medication management   5. Unintentional weight loss    PLAN:    Chronic systolic and diastolic heart failure: diagnosed 12/2017 -declines invasive workup, including cardiac cath -continue carvedilol, lisinopril at current doses for now -with unintentional weight loss, may need to scale back on  medications if weight continues to downtrend -I am ok with increased ensure intake as he is euvolemic and is losing weight -appears euvolemic, if weight continues to drop may need to change lasix to PRN -unclear if this is ischemic or nonischemic. Was started on aspirin. LDL 96, given age will not start statin  Moderate MR: secondary to dilated ventricle -euvolemic  Hypertension: well below goal, on relatively low doses of meds -BP cuff at home, will call if systolics less than 518 mHG.   Unintentional weight loss: I am concerned about this. Endorses poor appetite. From my perspective I would encourage him to eat/drink whatever appeals to him. Ideally less than 2L fluid/day, but if he needs more liquid nutrition, etc we can manage his volume  Plan for follow up: 6 mos or sooner PRN  TIME SPENT WITH PATIENT: 25 minutes of direct patient care. More than 50% of that time was spent on coordination of care and counseling regarding unintentional weight loss, monitoring, med management and HF education.  Buford Dresser, MD, PhD   [facility]   Medication Adjustments/Labs and Tests Ordered: Current medicines are reviewed at length with the patient today.  Concerns regarding medicines are outlined above.  Orders Placed This Encounter  Procedures  . EKG 12-Lead   Meds ordered this encounter  Medications  . furosemide (LASIX) 40 MG tablet    Sig: Take 1 tablet (40 mg total) by mouth daily.    Dispense:  90 tablet    Refill:  3  . carvedilol (COREG) 6.25 MG tablet    Sig: Take 1 tablet (6.25 mg total) by mouth 2 (two) times daily with a meal.    Dispense:  180 tablet    Refill:  3  . lisinopril (ZESTRIL) 5 MG tablet    Sig: Take 1 tablet (5 mg total) by mouth daily.    Dispense:  90 tablet    Refill:  3    Patient Instructions  Medication Instructions:  Your Physician recommend you continue on your current medication as directed.    If you need a refill on  your cardiac medications before your next appointment, please call your pharmacy.   Lab work: None  Testing/Procedures: None  Follow-Up: At [facility], you and your health needs are our priority.  As part of our continuing mission to provide you  with exceptional heart care, we have created designated Provider Care Teams.  These Care Teams include your primary Cardiologist (physician) and Advanced Practice Providers (APPs -  Physician Assistants and Nurse Practitioners) who all work together to provide you with the care you need, when you need it. You will need a follow up appointment in 6 months.  Please call our office 2 months in advance to schedule this appointment.  You may see Buford Dresser, MD or one of the following Advanced Practice Providers on your designated Care Team:   Rosaria Ferries, PA-C . Jory Sims, DNP, ANP       Signed, Buford Dresser, MD PhD 01/17/2019 1:42 PM    [place]

## 2019-01-19 ENCOUNTER — Encounter: Payer: Self-pay | Admitting: Cardiology

## 2019-01-19 DIAGNOSIS — R634 Abnormal weight loss: Secondary | ICD-10-CM | POA: Insufficient documentation

## 2019-01-19 DIAGNOSIS — I340 Nonrheumatic mitral (valve) insufficiency: Secondary | ICD-10-CM | POA: Insufficient documentation

## 2019-07-02 ENCOUNTER — Other Ambulatory Visit: Payer: Self-pay

## 2019-07-02 ENCOUNTER — Emergency Department (HOSPITAL_COMMUNITY): Payer: Medicare PPO

## 2019-07-02 ENCOUNTER — Encounter (HOSPITAL_COMMUNITY): Payer: Self-pay

## 2019-07-02 ENCOUNTER — Inpatient Hospital Stay (HOSPITAL_COMMUNITY)
Admission: EM | Admit: 2019-07-02 | Discharge: 2019-07-17 | DRG: 344 | Disposition: E | Payer: Medicare PPO | Attending: Family Medicine | Admitting: Family Medicine

## 2019-07-02 DIAGNOSIS — I5042 Chronic combined systolic (congestive) and diastolic (congestive) heart failure: Secondary | ICD-10-CM | POA: Diagnosis present

## 2019-07-02 DIAGNOSIS — E861 Hypovolemia: Secondary | ICD-10-CM | POA: Diagnosis present

## 2019-07-02 DIAGNOSIS — I130 Hypertensive heart and chronic kidney disease with heart failure and stage 1 through stage 4 chronic kidney disease, or unspecified chronic kidney disease: Secondary | ICD-10-CM | POA: Diagnosis present

## 2019-07-02 DIAGNOSIS — J189 Pneumonia, unspecified organism: Secondary | ICD-10-CM | POA: Diagnosis present

## 2019-07-02 DIAGNOSIS — R911 Solitary pulmonary nodule: Secondary | ICD-10-CM | POA: Diagnosis present

## 2019-07-02 DIAGNOSIS — Z7982 Long term (current) use of aspirin: Secondary | ICD-10-CM

## 2019-07-02 DIAGNOSIS — K562 Volvulus: Secondary | ICD-10-CM | POA: Diagnosis not present

## 2019-07-02 DIAGNOSIS — E860 Dehydration: Secondary | ICD-10-CM | POA: Diagnosis present

## 2019-07-02 DIAGNOSIS — N411 Chronic prostatitis: Secondary | ICD-10-CM | POA: Diagnosis present

## 2019-07-02 DIAGNOSIS — K5939 Other megacolon: Secondary | ICD-10-CM | POA: Diagnosis present

## 2019-07-02 DIAGNOSIS — Z20822 Contact with and (suspected) exposure to covid-19: Secondary | ICD-10-CM | POA: Diagnosis present

## 2019-07-02 DIAGNOSIS — R4182 Altered mental status, unspecified: Secondary | ICD-10-CM | POA: Diagnosis present

## 2019-07-02 DIAGNOSIS — H409 Unspecified glaucoma: Secondary | ICD-10-CM | POA: Diagnosis present

## 2019-07-02 DIAGNOSIS — R296 Repeated falls: Secondary | ICD-10-CM | POA: Diagnosis present

## 2019-07-02 DIAGNOSIS — Z8673 Personal history of transient ischemic attack (TIA), and cerebral infarction without residual deficits: Secondary | ICD-10-CM

## 2019-07-02 DIAGNOSIS — R0989 Other specified symptoms and signs involving the circulatory and respiratory systems: Secondary | ICD-10-CM

## 2019-07-02 DIAGNOSIS — Z66 Do not resuscitate: Secondary | ICD-10-CM | POA: Diagnosis present

## 2019-07-02 DIAGNOSIS — Z87891 Personal history of nicotine dependence: Secondary | ICD-10-CM

## 2019-07-02 DIAGNOSIS — R918 Other nonspecific abnormal finding of lung field: Secondary | ICD-10-CM

## 2019-07-02 DIAGNOSIS — I10 Essential (primary) hypertension: Secondary | ICD-10-CM | POA: Diagnosis present

## 2019-07-02 DIAGNOSIS — E870 Hyperosmolality and hypernatremia: Secondary | ICD-10-CM | POA: Diagnosis present

## 2019-07-02 DIAGNOSIS — A419 Sepsis, unspecified organism: Secondary | ICD-10-CM

## 2019-07-02 DIAGNOSIS — I340 Nonrheumatic mitral (valve) insufficiency: Secondary | ICD-10-CM | POA: Diagnosis present

## 2019-07-02 DIAGNOSIS — N179 Acute kidney failure, unspecified: Secondary | ICD-10-CM | POA: Diagnosis present

## 2019-07-02 DIAGNOSIS — H269 Unspecified cataract: Secondary | ICD-10-CM | POA: Diagnosis present

## 2019-07-02 DIAGNOSIS — N1831 Chronic kidney disease, stage 3a: Secondary | ICD-10-CM | POA: Diagnosis present

## 2019-07-02 DIAGNOSIS — Z79899 Other long term (current) drug therapy: Secondary | ICD-10-CM

## 2019-07-02 LAB — CBC WITH DIFFERENTIAL/PLATELET
Abs Immature Granulocytes: 0.12 10*3/uL — ABNORMAL HIGH (ref 0.00–0.07)
Basophils Absolute: 0 10*3/uL (ref 0.0–0.1)
Basophils Relative: 0 %
Eosinophils Absolute: 0 10*3/uL (ref 0.0–0.5)
Eosinophils Relative: 0 %
HCT: 35.1 % — ABNORMAL LOW (ref 39.0–52.0)
Hemoglobin: 10.8 g/dL — ABNORMAL LOW (ref 13.0–17.0)
Immature Granulocytes: 1 %
Lymphocytes Relative: 8 %
Lymphs Abs: 1.1 10*3/uL (ref 0.7–4.0)
MCH: 23.4 pg — ABNORMAL LOW (ref 26.0–34.0)
MCHC: 30.8 g/dL (ref 30.0–36.0)
MCV: 76.1 fL — ABNORMAL LOW (ref 80.0–100.0)
Monocytes Absolute: 1 10*3/uL (ref 0.1–1.0)
Monocytes Relative: 7 %
Neutro Abs: 12.6 10*3/uL — ABNORMAL HIGH (ref 1.7–7.7)
Neutrophils Relative %: 84 %
Platelets: 199 10*3/uL (ref 150–400)
RBC: 4.61 MIL/uL (ref 4.22–5.81)
RDW: 16.4 % — ABNORMAL HIGH (ref 11.5–15.5)
WBC: 14.9 10*3/uL — ABNORMAL HIGH (ref 4.0–10.5)
nRBC: 0 % (ref 0.0–0.2)

## 2019-07-02 LAB — COMPREHENSIVE METABOLIC PANEL
ALT: 189 U/L — ABNORMAL HIGH (ref 0–44)
AST: 182 U/L — ABNORMAL HIGH (ref 15–41)
Albumin: 3.3 g/dL — ABNORMAL LOW (ref 3.5–5.0)
Alkaline Phosphatase: 58 U/L (ref 38–126)
Anion gap: 13 (ref 5–15)
BUN: 51 mg/dL — ABNORMAL HIGH (ref 8–23)
CO2: 25 mmol/L (ref 22–32)
Calcium: 9.2 mg/dL (ref 8.9–10.3)
Chloride: 108 mmol/L (ref 98–111)
Creatinine, Ser: 2.25 mg/dL — ABNORMAL HIGH (ref 0.61–1.24)
GFR calc Af Amer: 27 mL/min — ABNORMAL LOW (ref >60)
GFR calc non Af Amer: 23 mL/min — ABNORMAL LOW (ref >60)
Glucose, Bld: 131 mg/dL — ABNORMAL HIGH (ref 70–99)
Potassium: 3.5 mmol/L (ref 3.5–5.1)
Sodium: 146 mmol/L — ABNORMAL HIGH (ref 135–145)
Total Bilirubin: 0.6 mg/dL (ref 0.3–1.2)
Total Protein: 7.7 g/dL (ref 6.5–8.1)

## 2019-07-02 LAB — URINALYSIS, ROUTINE W REFLEX MICROSCOPIC
Bilirubin Urine: NEGATIVE
Glucose, UA: NEGATIVE mg/dL
Ketones, ur: NEGATIVE mg/dL
Leukocytes,Ua: NEGATIVE
Nitrite: NEGATIVE
Protein, ur: 100 mg/dL — AB
Specific Gravity, Urine: 1.014 (ref 1.005–1.030)
pH: 5 (ref 5.0–8.0)

## 2019-07-02 LAB — TROPONIN I (HIGH SENSITIVITY)
Troponin I (High Sensitivity): 53 ng/L — ABNORMAL HIGH (ref <18)
Troponin I (High Sensitivity): 51 ng/L — ABNORMAL HIGH (ref <18)

## 2019-07-02 LAB — POC SARS CORONAVIRUS 2 AG -  ED: SARS Coronavirus 2 Ag: NEGATIVE

## 2019-07-02 LAB — LACTIC ACID, PLASMA: Lactic Acid, Venous: 2.7 mmol/L (ref 0.5–1.9)

## 2019-07-02 MED ORDER — IOHEXOL 9 MG/ML PO SOLN
500.0000 mL | ORAL | Status: AC
  Administered 2019-07-02 (×2): 500 mL via ORAL

## 2019-07-02 MED ORDER — METRONIDAZOLE IN NACL 5-0.79 MG/ML-% IV SOLN
500.0000 mg | Freq: Once | INTRAVENOUS | Status: AC
  Administered 2019-07-02: 500 mg via INTRAVENOUS
  Filled 2019-07-02: qty 100

## 2019-07-02 MED ORDER — SODIUM CHLORIDE 0.9 % IV SOLN
500.0000 mg | Freq: Once | INTRAVENOUS | Status: AC
  Administered 2019-07-02: 21:00:00 500 mg via INTRAVENOUS
  Filled 2019-07-02: qty 500

## 2019-07-02 MED ORDER — SODIUM CHLORIDE 0.9 % IV SOLN
1.0000 g | Freq: Once | INTRAVENOUS | Status: AC
  Administered 2019-07-02: 20:00:00 1 g via INTRAVENOUS
  Filled 2019-07-02: qty 10

## 2019-07-02 MED ORDER — LACTATED RINGERS IV BOLUS
1000.0000 mL | Freq: Once | INTRAVENOUS | Status: AC
  Administered 2019-07-02: 1000 mL via INTRAVENOUS

## 2019-07-02 NOTE — ED Provider Notes (Signed)
[facility]-EMERGENCY DEPT Provider Note   CSN: [id number] Arrival date & time: 06/21/2019  1808     History Chief Complaint  Patient presents with  . Altered Mental Status    Jack Zuniga is a 86 y.o. male.  Patient is a 86 year old male with a history of CHF, mitral regurgitation, malnutrition, stroke and constipation who is presenting from home where he lives alone because of lethargy today.  Speaking with his daughter who gives the history she reports that he was his normal self yesterday but then today he has been in bed all day long and has not gotten out of bed.  This morning he was unable to even sit up on his own and he normally gets out of bed and walks around with a walker.  He has not eaten anything all day long.  No report of fever, cough, chest pain, abdominal pain, nausea or vomiting.  Daughter reports they did decrease one blood pressure medication but he has had no other change in medications.  He has not been around anybody who has been ill.  On exam patient reports no issues.  The history is provided by the patient and a relative.  Altered Mental Status Presenting symptoms: lethargy        Past Medical History:  Diagnosis Date  . Cataracts, both eyes   . CHF (congestive heart failure) ([facility])   . Constipation   . CVA (cerebral vascular accident) ([facility])   . Glaucoma   . Hypertension   . Hypokalemia   . Kidney injury   . Stroke [facility])    2011 in [place], no deficits    Patient Active Problem List   Diagnosis Date Noted  . Non-rheumatic mitral regurgitation 01/19/2019  . Unintentional weight loss 01/19/2019  . Chronic combined systolic and diastolic congestive heart failure ([facility]) 03/24/2018  . CHF exacerbation ([facility]) 12/27/2017  . Bilateral cataracts 01/28/2015  . Glaucoma 01/28/2015  . Edema 01/28/2015  . Frequent falls 01/20/2015  . Neck pain 01/20/2015  . Protein-calorie malnutrition ([facility]) 01/17/2015  . Constipation 01/17/2015  .  Dehydration 01/16/2015  . Rhabdomyolysis 01/16/2015  . Essential hypertension 01/16/2015  . History of CVA (cerebrovascular accident) 01/16/2015    History reviewed. No pertinent surgical history.     Family History  Problem Relation Age of Onset  . Stroke Neg Hx   . Kidney disease Neg Hx     Social History   Tobacco Use  . Smoking status: Former Smoker    Years: 30.00    Types: Cigars  . Smokeless tobacco: Never Used  Substance Use Topics  . Alcohol use: No  . Drug use: No    Home Medications Prior to Admission medications   Medication Sig Start Date End Date Taking? Authorizing Provider  acetaminophen (TYLENOL) 325 MG tablet Take 650 mg by mouth every 6 (six) hours as needed (for pain.).    [provider]  aspirin 81 MG EC tablet Take 1 tablet (81 mg total) by mouth daily. 01/01/18   Barnetta Chapel, MD  carvedilol (COREG) 6.25 MG tablet Take 1 tablet (6.25 mg total) by mouth 2 (two) times daily with a meal. 01/17/19   Jodelle Red, MD  docusate sodium (COLACE) 100 MG capsule Take 1 capsule (100 mg total) by mouth 2 (two) times daily. 01/20/15   Rama, Maryruth Bun, MD  feeding supplement, ENSURE ENLIVE, (ENSURE ENLIVE) LIQD Take 237 mLs by mouth every morning. 01/20/15   Rama, Maryruth Bun, MD  furosemide (LASIX) 40 MG tablet Take 1 tablet (40 mg total) by mouth daily. 01/17/19   Jodelle Red, MD  latanoprost (XALATAN) 0.005 % ophthalmic solution Place 1 drop into both eyes at bedtime. 12/14/17   [provider]  lisinopril (ZESTRIL) 5 MG tablet Take 1 tablet (5 mg total) by mouth daily. 01/17/19 04/17/19  Jodelle Red, MD  Multiple Vitamins-Minerals (ONE-A-DAY MENS 50+ ADVANTAGE) TABS Take 1 tablet by mouth daily.    [provider]    Allergies    Patient has no known allergies.  Review of Systems   Review of Systems  All other systems reviewed and are negative.   Physical Exam Updated Vital Signs BP 135/77  (BP Location: Right Arm)   Pulse 92   Temp 98.2 F (36.8 C) (Oral)   Resp (!) 24   Ht 5\' 8"  (1.727 m)   Wt 75.8 kg   SpO2 94%   BMI 25.39 kg/m   Physical Exam Vitals and nursing note reviewed.  Constitutional:      General: He is not in acute distress.    Appearance: Normal appearance. He is well-developed and normal weight.  HENT:     Head: Normocephalic and atraumatic.  Eyes:     Conjunctiva/sclera: Conjunctivae normal.     Pupils: Pupils are equal, round, and reactive to light.  Cardiovascular:     Rate and Rhythm: Normal rate and regular rhythm.     Pulses: Normal pulses.     Heart sounds: Murmur present. Systolic murmur present with a grade of 2/6.  Pulmonary:     Effort: Pulmonary effort is normal. No respiratory distress.     Breath sounds: Normal breath sounds. No wheezing or rales.     Comments: Crackles heard in the right mid lung Abdominal:     General: There is no distension.     Palpations: Abdomen is soft.     Tenderness: There is no abdominal tenderness. There is no guarding or rebound.  Musculoskeletal:        General: No tenderness. Normal range of motion.     Cervical back: Normal range of motion and neck supple.     Right lower leg: No edema.     Left lower leg: No edema.  Skin:    General: Skin is warm and dry.     Capillary Refill: Capillary refill takes less than 2 seconds.     Findings: No erythema or rash.  Neurological:     General: No focal deficit present.     Mental Status: He is alert. Mental status is at baseline.     Comments: Oriented to person and place  Psychiatric:        Mood and Affect: Mood normal.        Behavior: Behavior normal.        Thought Content: Thought content normal.     ED Results / Procedures / Treatments   Labs (all labs ordered are listed, but only abnormal results are displayed) Labs Reviewed  CBC WITH DIFFERENTIAL/PLATELET - Abnormal; Notable for the following components:      Result Value   WBC 14.9  (*)    Hemoglobin 10.8 (*)    HCT 35.1 (*)    MCV 76.1 (*)    MCH 23.4 (*)    RDW 16.4 (*)    Neutro Abs 12.6 (*)    Abs Immature Granulocytes 0.12 (*)    All other components within normal limits  COMPREHENSIVE METABOLIC PANEL -  Abnormal; Notable for the following components:   Sodium 146 (*)    Glucose, Bld 131 (*)    BUN 51 (*)    Creatinine, Ser 2.25 (*)    Albumin 3.3 (*)    AST 182 (*)    ALT 189 (*)    GFR calc non Af Amer 23 (*)    GFR calc Af Amer 27 (*)    All other components within normal limits  LACTIC ACID, PLASMA - Abnormal; Notable for the following components:   Lactic Acid, Venous 2.7 (*)    All other components within normal limits  TROPONIN I (HIGH SENSITIVITY) - Abnormal; Notable for the following components:   Troponin I (High Sensitivity) 53 (*)    All other components within normal limits  URINE CULTURE  URINALYSIS, ROUTINE W REFLEX MICROSCOPIC  POC SARS CORONAVIRUS 2 AG -  ED  TROPONIN I (HIGH SENSITIVITY)    EKG EKG Interpretation  Date/Time:  Wednesday July 02 2019 18:28:39 EDT Ventricular Rate:  91 PR Interval:    QRS Duration: 97 QT Interval:  359 QTC Calculation: 442 R Axis:   24 Text Interpretation: Ectopic atrial rhythm Probable left atrial enlargement LVH with secondary repolarization abnormality No significant change since last tracing Confirmed by Gwyneth Sprout ([id number]) on 07/14/2019 6:43:01 PM   Radiology DG Chest Port 1 View  Result Date: 06/16/2019 CLINICAL DATA:  Weakness EXAM: PORTABLE CHEST 1 VIEW COMPARISON:  12/29/2017, 01/19/2015 FINDINGS: Mildly low lung volumes. Coarse chronic interstitial opacity. Patchy opacity right infrahilar lung probable atelectasis. Stable cardiomediastinal silhouette. Lucency beneath the right diaphragm presumably due to air distended colon. IMPRESSION: 1. Mild right infrahilar atelectasis or minimal infiltrate. 2. Lucency beneath the right diaphragm suspected to be secondary to prominent air  distension of the colon. Electronically Signed   By: Jasmine Pang M.D.   On: 07/01/2019 19:26    Procedures Procedures (including critical care time)  Medications Ordered in ED Medications - No data to display  ED Course  I have reviewed the triage vital signs and the nursing notes.  Pertinent labs & imaging results that were available during my care of the patient were reviewed by me and considered in my medical decision making (see chart for details).    MDM Rules/Calculators/A&P                      Patient is a 86 year old male presenting today with weakness and laying in bed all day today.  Patient was normal yesterday but today was not even able to sit up on his own or get out of bed.  Patient has no focal area of tenderness or cellulitis at this time.  He is afebrile.  He has no evidence of respiratory distress but does have some mild crackles in the right mid lobe.  No significant signs of lower extremity swelling at this time.  Concern for possible infectious etiology versus heart failure versus acute coronary syndrome which is atypical versus Covid.  Vital signs remained stable at baseline.  Labs and imaging pending.  Patient is able to move both upper and lower extremities without deficit and lower suspicion for stroke at this time.  /7:56 PM Patient with a leukocytosis of almost 15,000, hemoglobin of 10, elevated lactate of 2.7 and chest x-ray with mild right infrahilar atelectasis or infiltrate.  Given patient's new symptoms, abnormal lung findings and leukocytosis will cover with Rocephin and azithromycin.  Blood pressure remained stable.  Code sepsis was called.  CMP with new AKI with creatinine of 2.25 and mild hypernatremia of 146.  Chest x-ray also showed an air-filled structure in the right hemiabdomen which may be distended loop of colon so a KUB was done which showed similar signs and they recommended a CT.  We will do CT with oral contrast only.  UA still pending.  11:35  PM Ua without signs of infection.  CT of abd/pelvis shows acute sigmoid volvulus which could be the cause of his sx today.  Patient does not have apparent bowel ischemia or pneumatosis.  He also has a suspicious solid right middle lobe pulmonary nodule concerning for primary bronchogenic carcinoma or metastatic lesion to the lung.  Also concern for possible aspiration pneumonitis, significantly enlarged prostate as well as several other incidental findings.  Spoke with patient's daughter asking if he would want to have procedure done for attempted resolution of the sigmoid volvulus and she said she was not sure it would depend on what it would involve and she and her sister both make that decision.  Will discuss with GI and have a discussion with his family.  Patient at this time remains hemodynamically stable and has no complaints.  11:46 PM Spoke with Dr. Carlean Purl who will see the patient first thing in the morning and may do a flexible sigmoidoscopy.  Discussed this with his daughter who reports that she would just like to be notified in here all the possible pros and cons before she and her sister decide if he would want this done.  This was communicated to Dr. Arelia Longest as well.  Patient will be admitted overnight for further evaluation in the morning.  He remains in no acute distress at this time.  Flagyl was also included due to potential bowel source.  CRITICAL CARE Performed by: Anik Wesch Total critical care time: 30 minutes Critical care time was exclusive of separately billable procedures and treating other patients. Critical care was necessary to treat or prevent imminent or life-threatening deterioration. Critical care was time spent personally by me on the following activities: development of treatment plan with patient and/or surrogate as well as nursing, discussions with consultants, evaluation of patient's response to treatment, examination of patient, obtaining history from patient  or surrogate, ordering and performing treatments and interventions, ordering and review of laboratory studies, ordering and review of radiographic studies, pulse oximetry and re-evaluation of patient's condition.   Final Clinical Impression(s) / ED Diagnoses Final diagnoses:  AKI (acute kidney injury) ([place])  Sigmoid volvulus ([place])  Lung mass  Chronic prostatitis    Rx / DC Orders ED Discharge Orders    None       Blanchie Dessert, MD 06/16/2019 2347

## 2019-07-02 NOTE — ED Notes (Signed)
Pt transported to CT ?

## 2019-07-02 NOTE — ED Notes (Signed)
Pt aware that urine sample is needed.  

## 2019-07-02 NOTE — ED Triage Notes (Signed)
Pt BIBA from home. Pt has family frequently visit, but lives alone. Also home health.  Pt was acting at his normal middle of day yesterday. Pt has been less active than normal and more lethargic. Pt usually is ambulatory with a walker, has been bed bound. Pt A&Ox3 with EMS.  Stroke screen negative with EMS.  VSS with EMS.  128/64 HR 92  RR 22 96% RA 172 FSBG  20 L FA.

## 2019-07-02 NOTE — ED Notes (Signed)
Pt has been stuck x2 without success. Unable to obtain blood cultures.

## 2019-07-02 NOTE — ED Notes (Signed)
XR at bedside

## 2019-07-02 NOTE — Progress Notes (Signed)
Notified bedside nurse of need to draw blood cultures and address IVF.

## 2019-07-03 ENCOUNTER — Inpatient Hospital Stay (HOSPITAL_COMMUNITY): Payer: Medicare PPO

## 2019-07-03 ENCOUNTER — Encounter (HOSPITAL_COMMUNITY): Payer: Self-pay | Admitting: Internal Medicine

## 2019-07-03 ENCOUNTER — Encounter (HOSPITAL_COMMUNITY): Admission: EM | Disposition: E | Payer: Self-pay | Source: Home / Self Care | Attending: Family Medicine

## 2019-07-03 ENCOUNTER — Other Ambulatory Visit: Payer: Self-pay

## 2019-07-03 DIAGNOSIS — K562 Volvulus: Secondary | ICD-10-CM

## 2019-07-03 DIAGNOSIS — R6520 Severe sepsis without septic shock: Secondary | ICD-10-CM

## 2019-07-03 DIAGNOSIS — H269 Unspecified cataract: Secondary | ICD-10-CM | POA: Diagnosis present

## 2019-07-03 DIAGNOSIS — N411 Chronic prostatitis: Secondary | ICD-10-CM | POA: Diagnosis present

## 2019-07-03 DIAGNOSIS — E860 Dehydration: Secondary | ICD-10-CM | POA: Diagnosis present

## 2019-07-03 DIAGNOSIS — K5939 Other megacolon: Secondary | ICD-10-CM | POA: Diagnosis present

## 2019-07-03 DIAGNOSIS — R296 Repeated falls: Secondary | ICD-10-CM | POA: Diagnosis present

## 2019-07-03 DIAGNOSIS — J189 Pneumonia, unspecified organism: Secondary | ICD-10-CM | POA: Diagnosis present

## 2019-07-03 DIAGNOSIS — R911 Solitary pulmonary nodule: Secondary | ICD-10-CM | POA: Diagnosis present

## 2019-07-03 DIAGNOSIS — A419 Sepsis, unspecified organism: Secondary | ICD-10-CM

## 2019-07-03 DIAGNOSIS — R4182 Altered mental status, unspecified: Secondary | ICD-10-CM | POA: Diagnosis present

## 2019-07-03 DIAGNOSIS — Z8673 Personal history of transient ischemic attack (TIA), and cerebral infarction without residual deficits: Secondary | ICD-10-CM | POA: Diagnosis not present

## 2019-07-03 DIAGNOSIS — H409 Unspecified glaucoma: Secondary | ICD-10-CM | POA: Diagnosis present

## 2019-07-03 DIAGNOSIS — I5043 Acute on chronic combined systolic (congestive) and diastolic (congestive) heart failure: Secondary | ICD-10-CM | POA: Diagnosis not present

## 2019-07-03 DIAGNOSIS — Z87891 Personal history of nicotine dependence: Secondary | ICD-10-CM | POA: Diagnosis not present

## 2019-07-03 DIAGNOSIS — I5042 Chronic combined systolic (congestive) and diastolic (congestive) heart failure: Secondary | ICD-10-CM | POA: Diagnosis present

## 2019-07-03 DIAGNOSIS — Z66 Do not resuscitate: Secondary | ICD-10-CM

## 2019-07-03 DIAGNOSIS — E861 Hypovolemia: Secondary | ICD-10-CM | POA: Diagnosis present

## 2019-07-03 DIAGNOSIS — Z7982 Long term (current) use of aspirin: Secondary | ICD-10-CM | POA: Diagnosis not present

## 2019-07-03 DIAGNOSIS — R0989 Other specified symptoms and signs involving the circulatory and respiratory systems: Secondary | ICD-10-CM | POA: Diagnosis not present

## 2019-07-03 DIAGNOSIS — E870 Hyperosmolality and hypernatremia: Secondary | ICD-10-CM | POA: Diagnosis present

## 2019-07-03 DIAGNOSIS — I130 Hypertensive heart and chronic kidney disease with heart failure and stage 1 through stage 4 chronic kidney disease, or unspecified chronic kidney disease: Secondary | ICD-10-CM | POA: Diagnosis present

## 2019-07-03 DIAGNOSIS — Z79899 Other long term (current) drug therapy: Secondary | ICD-10-CM | POA: Diagnosis not present

## 2019-07-03 DIAGNOSIS — N179 Acute kidney failure, unspecified: Secondary | ICD-10-CM

## 2019-07-03 DIAGNOSIS — N1831 Chronic kidney disease, stage 3a: Secondary | ICD-10-CM | POA: Diagnosis present

## 2019-07-03 DIAGNOSIS — Z20822 Contact with and (suspected) exposure to covid-19: Secondary | ICD-10-CM | POA: Diagnosis present

## 2019-07-03 DIAGNOSIS — I340 Nonrheumatic mitral (valve) insufficiency: Secondary | ICD-10-CM | POA: Diagnosis present

## 2019-07-03 HISTORY — PX: BOWEL DECOMPRESSION: SHX5532

## 2019-07-03 HISTORY — PX: FLEXIBLE SIGMOIDOSCOPY: SHX5431

## 2019-07-03 LAB — CBC WITH DIFFERENTIAL/PLATELET
Abs Immature Granulocytes: 0.06 10*3/uL (ref 0.00–0.07)
Basophils Absolute: 0 10*3/uL (ref 0.0–0.1)
Basophils Relative: 0 %
Eosinophils Absolute: 0 10*3/uL (ref 0.0–0.5)
Eosinophils Relative: 0 %
HCT: 37.5 % — ABNORMAL LOW (ref 39.0–52.0)
Hemoglobin: 11.7 g/dL — ABNORMAL LOW (ref 13.0–17.0)
Immature Granulocytes: 1 %
Lymphocytes Relative: 7 %
Lymphs Abs: 0.9 10*3/uL (ref 0.7–4.0)
MCH: 24.2 pg — ABNORMAL LOW (ref 26.0–34.0)
MCHC: 31.2 g/dL (ref 30.0–36.0)
MCV: 77.5 fL — ABNORMAL LOW (ref 80.0–100.0)
Monocytes Absolute: 0.8 10*3/uL (ref 0.1–1.0)
Monocytes Relative: 6 %
Neutro Abs: 11.3 10*3/uL — ABNORMAL HIGH (ref 1.7–7.7)
Neutrophils Relative %: 86 %
Platelets: 221 10*3/uL (ref 150–400)
RBC: 4.84 MIL/uL (ref 4.22–5.81)
RDW: 17.1 % — ABNORMAL HIGH (ref 11.5–15.5)
WBC: 13.2 10*3/uL — ABNORMAL HIGH (ref 4.0–10.5)
nRBC: 0 % (ref 0.0–0.2)

## 2019-07-03 LAB — COMPREHENSIVE METABOLIC PANEL
ALT: 164 U/L — ABNORMAL HIGH (ref 0–44)
AST: 134 U/L — ABNORMAL HIGH (ref 15–41)
Albumin: 3.5 g/dL (ref 3.5–5.0)
Alkaline Phosphatase: 62 U/L (ref 38–126)
Anion gap: 12 (ref 5–15)
BUN: 53 mg/dL — ABNORMAL HIGH (ref 8–23)
CO2: 25 mmol/L (ref 22–32)
Calcium: 9.3 mg/dL (ref 8.9–10.3)
Chloride: 107 mmol/L (ref 98–111)
Creatinine, Ser: 2.04 mg/dL — ABNORMAL HIGH (ref 0.61–1.24)
GFR calc Af Amer: 31 mL/min — ABNORMAL LOW (ref >60)
GFR calc non Af Amer: 26 mL/min — ABNORMAL LOW (ref >60)
Glucose, Bld: 110 mg/dL — ABNORMAL HIGH (ref 70–99)
Potassium: 4.1 mmol/L (ref 3.5–5.1)
Sodium: 144 mmol/L (ref 135–145)
Total Bilirubin: 1 mg/dL (ref 0.3–1.2)
Total Protein: 8.5 g/dL — ABNORMAL HIGH (ref 6.5–8.1)

## 2019-07-03 LAB — RESPIRATORY PANEL BY RT PCR (FLU A&B, COVID)
Influenza A by PCR: NEGATIVE
Influenza B by PCR: NEGATIVE
SARS Coronavirus 2 by RT PCR: NEGATIVE

## 2019-07-03 LAB — PROCALCITONIN: Procalcitonin: 5.24 ng/mL

## 2019-07-03 LAB — LACTIC ACID, PLASMA: Lactic Acid, Venous: 2 mmol/L (ref 0.5–1.9)

## 2019-07-03 SURGERY — SIGMOIDOSCOPY, FLEXIBLE
Anesthesia: Moderate Sedation

## 2019-07-03 MED ORDER — PIPERACILLIN-TAZOBACTAM IN DEX 2-0.25 GM/50ML IV SOLN
2.2500 g | Freq: Three times a day (TID) | INTRAVENOUS | Status: DC
  Administered 2019-07-04 (×4): 2.25 g via INTRAVENOUS
  Filled 2019-07-03 (×9): qty 50

## 2019-07-03 MED ORDER — SODIUM CHLORIDE 0.9 % IV SOLN
INTRAVENOUS | Status: AC | PRN
  Administered 2019-07-03: 500 mL via INTRAMUSCULAR

## 2019-07-03 MED ORDER — PIPERACILLIN-TAZOBACTAM 3.375 G IVPB 30 MIN: 3.3750 g | Freq: Three times a day (TID) | INTRAVENOUS | Status: DC

## 2019-07-03 MED ORDER — MIDAZOLAM HCL (PF) 10 MG/2ML IJ SOLN
INTRAMUSCULAR | Status: DC | PRN
  Administered 2019-07-03 (×2): 1 mg via INTRAVENOUS

## 2019-07-03 MED ORDER — ONDANSETRON HCL 4 MG/2ML IJ SOLN: 4.0000 mg | Freq: Four times a day (QID) | INTRAMUSCULAR | Status: DC | PRN

## 2019-07-03 MED ORDER — SODIUM CHLORIDE 0.9 % IV SOLN: INTRAVENOUS | Status: DC

## 2019-07-03 MED ORDER — MIDAZOLAM HCL (PF) 5 MG/ML IJ SOLN
INTRAMUSCULAR | Status: AC
  Filled 2019-07-03: qty 1

## 2019-07-03 MED ORDER — LABETALOL HCL 5 MG/ML IV SOLN: 10.0000 mg | INTRAVENOUS | Status: DC | PRN

## 2019-07-03 MED ORDER — FENTANYL CITRATE (PF) 100 MCG/2ML IJ SOLN
INTRAMUSCULAR | Status: DC | PRN
  Administered 2019-07-03: 25 ug via INTRAVENOUS

## 2019-07-03 MED ORDER — FENTANYL CITRATE (PF) 100 MCG/2ML IJ SOLN
INTRAMUSCULAR | Status: AC
  Filled 2019-07-03: qty 2

## 2019-07-03 MED ORDER — LACTATED RINGERS IV SOLN: INTRAVENOUS | Status: AC

## 2019-07-03 MED ORDER — MORPHINE SULFATE (PF) 2 MG/ML IV SOLN: 2.0000 mg | INTRAVENOUS | Status: DC | PRN

## 2019-07-03 MED ORDER — POLYETHYLENE GLYCOL 3350 17 G PO PACK
17.0000 g | PACK | Freq: Every day | ORAL | Status: DC
  Administered 2019-07-04: 17 g via ORAL
  Filled 2019-07-03: qty 1

## 2019-07-03 MED ORDER — ONDANSETRON HCL 4 MG PO TABS: 4.0000 mg | ORAL_TABLET | Freq: Four times a day (QID) | ORAL | Status: DC | PRN

## 2019-07-03 NOTE — Op Note (Addendum)
[facility] Patient Name: Jack Zuniga Procedure Date: 07/11/2019 MRN: [id number] Attending MD: Jerene Bears , MD Date of Birth: 1919-08-25 CSN: [id number] Age: 86 Admit Type: Inpatient Procedure:                Flexible Sigmoidoscopy Indications:              For therapy of sigmoid volvulus Providers:                Lajuan Lines. Hilarie Fredrickson, MD, Cleda Daub, RN, William Dalton, Technician Referring MD:             [facility] Medicines:                Fentanyl 25 micrograms IV, Midazolam 2 mg IV, None Complications:            No immediate complications. Estimated Blood Loss:     Estimated blood loss: none. Procedure:                Pre-Anesthesia Assessment:                           - Prior to the procedure, a History and Physical                            was performed, and patient medications and                            allergies were reviewed. The patient's tolerance of                            previous anesthesia was also reviewed. The risks                            and benefits of the procedure and the sedation                            options and risks were discussed with the patient.                            All questions were answered, and informed consent                            was obtained. Prior Anticoagulants: The patient has                            taken no previous anticoagulant or antiplatelet                            agents. ASA Grade Assessment: III - A patient with                            severe systemic disease. After reviewing the risks  and benefits, the patient was deemed in                            satisfactory condition to undergo the procedure.                           After obtaining informed consent, the scope was                            passed under direct vision. The PCF-H190DL                            ([id number] Olympus pediatric colonoscope was                        introduced through the anus and advanced to the                            sigmoid colon. The flexible sigmoidoscopy was                            accomplished without difficulty. The patient                            tolerated the procedure well. The quality of the                            bowel preparation was No Prep. Scope In: 3:24:03 PM Scope Out: 3:32:09 PM Total Procedure Duration: 0 hours 8 minutes 6 seconds  Findings:      The digital rectal exam findings include anal mild stenosis. Pertinent       negatives include no palpable rectal lesions.      A volvulus with viable appearing mucosa was found in the distal sigmoid       colon. Decompression of the volvulus was attempted and was successful,       with complete decompression achieved. The colonoscopy was passed across       the involved segment multiple times in attempt to maintain patency.      The lumen of the sigmoid colon was significantly dilated above the       volvulated segment. The mucosa did not appear ischemic.      A large amount of stool was found in the rectum, in the recto-sigmoid       colon and in the sigmoid colon, making visualization of the mucosa       difficult. No mass lesions were seen.      Retroflexion in the rectum was not performed due to stool in the rectum. Impression:               - Distal sigmoid volvulus. Successful decompression                            achieved.                           - Dilated sigmoid colon.                           -  No evidence of significant mucosal ischemia. No                            mass lesions were seen though stool present                            throughout the examined colon.                           - No specimens collected.                           - Images could not be captured due to technical                            difficulty with the processor. Image quality during                            the entire procedure  was normal. Moderate Sedation:      Moderate (conscious) sedation was administered by the endoscopy nurse       and supervised by the endoscopist. The following parameters were       monitored: oxygen saturation, heart rate, blood pressure, and response       to care. Total physician intraservice time was 16 minutes. Recommendation:           - Return patient to hospital ward for ongoing care.                           - Liquid diet, advance diet as tolerated.                           - Daily MiraLax to avoid constipation.                           - Ultimately treatment for volvulus is surgical,                            but with advanced age patient and family do not                            wish to pursue surgical intervention.                           - GI will be available, call with questions. Procedure Code(s):        --- Professional ---                           [phone], Sigmoidoscopy, flexible; with decompression                            (for pathologic distention) (eg, volvulus,                            megacolon), including placement of decompression  tube, when performed                           G0500, Moderate sedation services provided by the                            same physician or other qualified health care                            professional performing a gastrointestinal                            endoscopic service that sedation supports,                            requiring the presence of an independent trained                            observer to assist in the monitoring of the                            patient's level of consciousness and physiological                            status; initial 15 minutes of intra-service time;                            patient age 81 years or older (additional time may                            be reported with [id number], as appropriate) Diagnosis Code(s):        --- Professional ---                            K56.2, Volvulus                           K59.39, Other megacolon CPT copyright 2019 [employer]. All rights reserved. The codes documented in this report are preliminary and upon coder review may  be revised to meet current compliance requirements. Beverley Fiedler, MD 2019/07/26 3:44:30 PM This report has been signed electronically. Number of Addenda: 0

## 2019-07-03 NOTE — Progress Notes (Signed)
PHARMACY NOTE:  ANTIMICROBIAL RENAL DOSAGE ADJUSTMENT  Current antimicrobial regimen includes a mismatch between antimicrobial dosage and estimated renal function.  As per policy approved by the Pharmacy & Therapeutics and Medical Executive Committees, the antimicrobial dosage will be adjusted accordingly.  Current antimicrobial dosage: Zosyn 3.375g IV q8h  Indication: sepsis, sigmoid volvulus   Renal Function:  Estimated Creatinine Clearance: 17.7 mL/min (A) (by C-G formula based on SCr of 2.25 mg/dL (H)). []      On intermittent HD, scheduled: []      On CRRT    Antimicrobial dosage has been changed to: Zosyn 2.25g IV q8h    Thank you for allowing pharmacy to be a part of this patient's care.  , [facility] 07-11-2019 5:49 AM

## 2019-07-03 NOTE — Progress Notes (Signed)
    Progress Note     Briefly, I stopped by the patient's room this morning to ensure that he was ready for flex sigmoidoscopy this afternoon and he is declining the procedure.  He was oriented to self and place.  Tells me that he is fully able to make his own medical decisions and does not want to have this done.  I explained the procedure in detail to him as well as risks and benefits and what may happen if he does not have it done.  He still does not want to do it and says "I will think about it later".    Discussed case with Dr. Leone Payor who did see the patient yesterday, apparently patient did have altered mental status last night and conversations were mainly had through his family who finally agreed to procedure.  They told Dr. Leone Payor they would be here around 10:00 this morning.  Possibly we can have another discussion when his family is in the room, but for now will not proceed.    Rectal tube in place with drainage of 100 cc of Crystal liquid stool, abdomen still tense with generalized abdominal TTP on exam.  Hyacinth Meeker, PA-C 07/14/2019 1012

## 2019-07-03 NOTE — ED Notes (Signed)
ED TO INPATIENT HANDOFF REPORT  Name/Age/Gender Jack Zuniga 86 y.o. male  Code Status Code Status History    Date Active Date Inactive Code Status Order ID Comments User Context   12/27/2017 1030 12/31/2017 2258 DNR [id number]  Karmen Bongo, MD ED   01/16/2015 0431 01/20/2015 1657 Full Code [id number]  Danford, Suann Larry, MD Inpatient   Advance Care Planning Activity    Questions for Most Recent Historical Code Status (Order [id number])    Question Answer Comment   In the event of cardiac or respiratory ARREST Do not call a "code blue"    In the event of cardiac or respiratory ARREST Do not perform Intubation, CPR, defibrillation or ACLS    In the event of cardiac or respiratory ARREST Use medication by any route, position, wound care, and other measures to relive pain and suffering. May use oxygen, suction and manual treatment of airway obstruction as needed for comfort.       Home/SNF/Other Home  Chief Complaint Sigmoid volvulus ([place]) [K56.2]  Level of Care/Admitting Diagnosis ED Disposition    ED Disposition Condition Comment   Admit  Hospital Area: [place] [[id number]]  Level of Care: Med-Surg [16]  May admit patient to [facility] or [facility] if equivalent level of care is available:: Yes  Covid Evaluation: Confirmed COVID Negative  Diagnosis: Sigmoid volvulus [facility]) [[id number]]  Admitting Physician: Bridgett Larsson Edon  Attending Physician: Bridgett Larsson, ERIC [[id number]]  Estimated length of stay: 5 - 7 days  Certification:: I certify this patient will need inpatient services for at least 2 midnights       Medical History Past Medical History:  Diagnosis Date  . Cataracts, both eyes   . CHF (congestive heart failure) ([place])   . Constipation   . CVA (cerebral vascular accident) ([place])   . Glaucoma   . Hypertension   . Hypokalemia   . Kidney injury   . Stroke [facility])    2011 in [place], no deficits    Allergies No Known Allergies  IV  Location/Drains/Wounds Patient Lines/Drains/Airways Status   Active Line/Drains/Airways    Name:   Placement date:   Placement time:   Site:   Days:   Peripheral IV 06/27/2019 Anterior;Left Forearm   07/07/2019    --    Forearm   1          Labs/Imaging Results for orders placed or performed during the hospital encounter of 07/01/2019 (from the past 48 hour(s))  Urinalysis, Routine w reflex microscopic     Status: Abnormal   Collection Time: 06/27/2019  7:21 PM  Result Value Ref Range   Color, Urine YELLOW YELLOW   APPearance HAZY (A) CLEAR   Specific Gravity, Urine 1.014 1.005 - 1.030   pH 5.0 5.0 - 8.0   Glucose, UA NEGATIVE NEGATIVE mg/dL   Hgb urine dipstick MODERATE (A) NEGATIVE   Bilirubin Urine NEGATIVE NEGATIVE   Ketones, ur NEGATIVE NEGATIVE mg/dL   Protein, ur 100 (A) NEGATIVE mg/dL   Nitrite NEGATIVE NEGATIVE   Leukocytes,Ua NEGATIVE NEGATIVE   RBC / HPF 21-50 0 - 5 RBC/hpf   WBC, UA 6-10 0 - 5 WBC/hpf   Bacteria, UA FEW (A) NONE SEEN   Mucus PRESENT    Hyaline Casts, UA PRESENT    Amorphous Crystal PRESENT     Comment: Performed at [facility], [place] [address], [place], [place] [address]  CBC with Differential/Platelet     Status: Abnormal   Collection Time:  07/12/2019  7:23 PM  Result Value Ref Range   WBC 14.9 (H) 4.0 - 10.5 K/uL   RBC 4.61 4.22 - 5.81 MIL/uL   Hemoglobin 10.8 (L) 13.0 - 17.0 g/dL   HCT 35.1 (L) 39.0 - 52.0 %   MCV 76.1 (L) 80.0 - 100.0 fL   MCH 23.4 (L) 26.0 - 34.0 pg   MCHC 30.8 30.0 - 36.0 g/dL   RDW 16.4 (H) 11.5 - 15.5 %   Platelets 199 150 - 400 K/uL   nRBC 0.0 0.0 - 0.2 %   Neutrophils Relative % 84 %   Neutro Abs 12.6 (H) 1.7 - 7.7 K/uL   Lymphocytes Relative 8 %   Lymphs Abs 1.1 0.7 - 4.0 K/uL   Monocytes Relative 7 %   Monocytes Absolute 1.0 0.1 - 1.0 K/uL   Eosinophils Relative 0 %   Eosinophils Absolute 0.0 0.0 - 0.5 K/uL   Basophils Relative 0 %   Basophils Absolute 0.0 0.0 - 0.1 K/uL   Immature Granulocytes  1 %   Abs Immature Granulocytes 0.12 (H) 0.00 - 0.07 K/uL    Comment: Performed at [facility], [place] [address], [place], [place] [address]  Comprehensive metabolic panel     Status: Abnormal   Collection Time: 07/14/2019  7:23 PM  Result Value Ref Range   Sodium 146 (H) 135 - 145 mmol/L   Potassium 3.5 3.5 - 5.1 mmol/L   Chloride 108 98 - 111 mmol/L   CO2 25 22 - 32 mmol/L   Glucose, Bld 131 (H) 70 - 99 mg/dL    Comment: Glucose reference range applies only to samples taken after fasting for at least 8 hours.   BUN 51 (H) 8 - 23 mg/dL   Creatinine, Ser 2.25 (H) 0.61 - 1.24 mg/dL   Calcium 9.2 8.9 - 10.3 mg/dL   Total Protein 7.7 6.5 - 8.1 g/dL   Albumin 3.3 (L) 3.5 - 5.0 g/dL   AST 182 (H) 15 - 41 U/L   ALT 189 (H) 0 - 44 U/L   Alkaline Phosphatase 58 38 - 126 U/L   Total Bilirubin 0.6 0.3 - 1.2 mg/dL   GFR calc non Af Amer 23 (L) >60 mL/min   GFR calc Af Amer 27 (L) >60 mL/min   Anion gap 13 5 - 15    Comment: Performed at [facility], [place] [address] [place], [place] [address]  Lactic acid, plasma     Status: Abnormal   Collection Time: 06/24/2019  7:23 PM  Result Value Ref Range   Lactic Acid, Venous 2.7 (HH) 0.5 - 1.9 mmol/L    Comment: CRITICAL RESULT CALLED TO, READ BACK BY AND VERIFIED WITH: J OXNEDINE AT 1953 ON 06/26/2019 BY MOSLEY,J Performed at [facility], [place] [address], [place], [place] [address]   Troponin I (High Sensitivity)     Status: Abnormal   Collection Time: 06/23/2019  7:23 PM  Result Value Ref Range   Troponin I (High Sensitivity) 53 (H) <18 ng/L    Comment: (NOTE) Elevated high sensitivity troponin I (hsTnI) values and significant  changes across serial measurements may suggest ACS but many other  chronic and acute conditions are known to elevate hsTnI results.  Refer to the "Links" section for chest pain algorithms and additional  guidance. Performed at [facility], [place]  [address], [place], [place] [address]   Troponin I (High Sensitivity)     Status: Abnormal  Collection Time: 07/01/2019  8:51 PM  Result Value Ref Range   Troponin I (High Sensitivity) 51 (H) <18 ng/L    Comment: (NOTE) Elevated high sensitivity troponin I (hsTnI) values and significant  changes across serial measurements may suggest ACS but many other  chronic and acute conditions are known to elevate hsTnI results.  Refer to the "Links" section for chest pain algorithms and additional  guidance. Performed at [facility], [place] [address], [place], [place] [address]   POC SARS Coronavirus 2 Ag-ED - Nasal Swab (BD Veritor Kit)     Status: None   Collection Time: 07/10/2019  9:05 PM  Result Value Ref Range   SARS Coronavirus 2 Ag NEGATIVE NEGATIVE    Comment: (NOTE) SARS-CoV-2 antigen NOT DETECTED.  Negative results are presumptive.  Negative results do not preclude SARS-CoV-2 infection and should not be used as the sole basis for treatment or other patient management decisions, including infection  control decisions, particularly in the presence of clinical signs and  symptoms consistent with COVID-19, or in those who have been in contact with the virus.  Negative results must be combined with clinical observations, patient history, and epidemiological information. The expected result is Negative. Fact Sheet for Patients: PodPark.tn Fact Sheet for Healthcare Providers: GiftContent.is This test is not yet approved or cleared by the [place] FDA and  has been authorized for detection and/or diagnosis of SARS-CoV-2 by FDA under an Emergency Use Authorization (EUA).  This EUA will remain in effect (meaning this test can be used) for the duration of  the COVID-19 de claration under Section 564(b)(1) of the Act, 21 U.S.C. section 360bbb-3(b)(1), unless the authorization is terminated or revoked sooner.    Respiratory Panel by RT PCR (Flu A&B, Covid) - Nasopharyngeal Swab     Status: None   Collection Time: 07/04/2019 11:40 PM   Specimen: Nasopharyngeal Swab  Result Value Ref Range   SARS Coronavirus 2 by RT PCR NEGATIVE NEGATIVE    Comment: (NOTE) SARS-CoV-2 target nucleic acids are NOT DETECTED. The SARS-CoV-2 RNA is generally detectable in upper respiratoy specimens during the acute phase of infection. The lowest concentration of SARS-CoV-2 viral copies this assay can detect is 131 copies/mL. A negative result does not preclude SARS-Cov-2 infection and should not be used as the sole basis for treatment or other patient management decisions. A negative result may occur with  improper specimen collection/handling, submission of specimen other than nasopharyngeal swab, presence of viral mutation(s) within the areas targeted by this assay, and inadequate number of viral copies (<131 copies/mL). A negative result must be combined with clinical observations, patient history, and epidemiological information. The expected result is Negative. Fact Sheet for Patients:  PinkCheek.be Fact Sheet for Healthcare Providers:  GravelBags.it This test is not yet ap proved or cleared by the [place] FDA and  has been authorized for detection and/or diagnosis of SARS-CoV-2 by FDA under an Emergency Use Authorization (EUA). This EUA will remain  in effect (meaning this test can be used) for the duration of the COVID-19 declaration under Section 564(b)(1) of the Act, 21 U.S.C. section 360bbb-3(b)(1), unless the authorization is terminated or revoked sooner.    Influenza A by PCR NEGATIVE NEGATIVE   Influenza B by PCR NEGATIVE NEGATIVE    Comment: (NOTE) The Xpert Xpress SARS-CoV-2/FLU/RSV assay is intended as an aid in  the diagnosis of influenza from Nasopharyngeal swab specimens and  should not be used as a sole basis for treatment. Nasal  washings and  aspirates are unacceptable for Xpert Xpress SARS-CoV-2/FLU/RSV  testing. Fact Sheet for Patients: PinkCheek.be Fact Sheet for Healthcare Providers: GravelBags.it This test is not yet approved or cleared by the [place] FDA and  has been authorized for detection and/or diagnosis of SARS-CoV-2 by  FDA under an Emergency Use Authorization (EUA). This EUA will remain  in effect (meaning this test can be used) for the duration of the  Covid-19 declaration under Section 564(b)(1) of the Act, 21  U.S.C. section 360bbb-3(b)(1), unless the authorization is  terminated or revoked. Performed at [facility], [place] [address] [place], [place] [address]    CT ABDOMEN PELVIS WO CONTRAST  Addendum Date: 06/27/2019   ADDENDUM REPORT: 06/30/2019 23:51 ADDENDUM: The results worrisome for malignancy or metastasis will be called to the post-operative clinician or representative tomorrow morning by the Radiologist Assistant, and communication documented in the PACS or [employer]. Electronically Signed   By: Revonda Humphrey   On: 06/25/2019 23:51   Result Date: 07/01/2019 CLINICAL DATA:  Abdominal distension.  Altered mental status. EXAM: CT ABDOMEN AND PELVIS WITHOUT CONTRAST TECHNIQUE: Multidetector CT imaging of the abdomen and pelvis was performed following the standard protocol without IV contrast. COMPARISON:  January 17, 2015 FINDINGS: Lower chest: Moderate cardiomegaly with physiologic amount of pericardial fluid. Moderate to severe LAD, right main and left circumflex coronary calcification. Aortic valvular calcification. Patulous distal esophagus. Mild bronchiectasis with probable aspiration in the right middle lobe and lingula. Bilateral bronchitis. Suspicious 18 x 13 mm solid nodule noncalcified in the right middle lobe, series 3, image 7. Additional smaller sub solid and mixed nodularities in the same region  could be present given the patchy consolidation and alveolar disease in both lungs. Mild calcified atherosclerosis and 3.5 cm diameter of the descending thoracic aorta. Hepatobiliary: Arm and telemetry leads placement markedly degrade evaluation of the vessel parenchyma. Normal hepatic size. Pancreas: Mild atrophy.  No acute pancreatitis. Spleen: Small spleen with normal splenic contours measuring 7.4 x 2.0 cm. Adrenals/Urinary Tract: Abnormal urinary bladder trabeculation and mild diffuse wall thickening with small bladder diverticula better probably postobstructive from prostatic hyperplasia. Bilateral medical renal disease. No hydronephrosis for nephroureteral calculus. No apparent abnormality of the adrenal glands. Stomach/Bowel: Decompressed gastric and small bowel lumens without obstruction or apparent mucosal thickening. Abnormal 12 cm gaseous distension of a redundant sigmoid colon in the right abdomen with 360 degree counterclockwise volvulus of the efferent loop. No evidence of bowel wall thickening or pneumatosis coli. A small amount of stool in the rectum. Mild gaseous distension of the more proximal colon, which is displaced towards the left abdomen. Normal appendix, coronal image 43. Vascular/Lymphatic: Aorto bi-iliac calcified atherosclerosis without an abdominal aortic aneurysm. Nonspecific bilateral inguinal lymph nodes. An enlarged right inguinal 11 Hounsfield unit, 17 x 20 mm fluid collection with lobulated contours. Reproductive: Massive prostatomegaly measuring 7.6 cm diameter with mass effect on the posterior urinary bladder. Other: Trace 6 Hounsfield unit free fluid in the dependent right hemipelvis. The Musculoskeletal: Diffuse permeative bone pattern and moderate degenerative changes. A focal fat-density lytic lesion in the right iliac crest, likely benign and possibly an hemangioma. Critical Value/emergent results were called by telephone at the time of interpretation on 07/08/2019 at 11:06  pm to provider DR. Blanchie Dessert , who verbally acknowledged these results. IMPRESSION: Acute abdomen with sigmoid volvulus. Emergent surgical consultation is indicated. No apparent bowel ischemia or pneumatosis. A suspicious solid right middle lobe pulmonary nodule, highly worrisome for a primary  bronchopulmonary carcinoma or metastatic lesion to the lung. Further evaluation with 18- fluorine FDG PET CT or histopathological sampling could be considered. Moderate cardiomegaly and moderate to severe LAD coronary calcification with bilateral bronchitis and bronchiectasis, and patchy airspace and alveolar disease in both lungs concerning for possible aspiration-pneumonitis. An additional underlying pulmonary nodularity could also be present. Massive prostatomegaly and urinary bladder findings concerning for urinary outlet obstruction. Abnormal permeative bone marrow pattern, possibly secondary to aggressive osteopenia or a benign or malignant marrow infiltrating disease. Aortic calcified atherosclerosis and 3.5 cm ectatic caliber of the descending thoracic aorta. A 17 x 20 mm 11 Hounsfield unit lobular density in the right inguinal canal, probably a hernia repair plug. Correlation with surgical history recommended. Electronically Signed: By: Revonda Humphrey On: 07/15/2019 23:29   DG Abdomen 1 View  Result Date: 06/23/2019 CLINICAL DATA:  86 year old male with prominent air noted in the chest x-ray. EXAM: ABDOMEN - 1 VIEW COMPARISON:  Chest radiograph dated 07/16/2019 abdominal radiograph dated 01/17/2015 and CT abdomen pelvis dated 01/17/2015. FINDINGS: Evaluation is limited as the upper abdomen is not included in the images. There is a large gas-filled structure in the right hemiabdomen which is suboptimally evaluated. The pattern of air likely represents a distended loop of colon in this patient with history of prior colonic ileus. Pneumoperitoneum is much less likely. Repeat radiograph with inclusion of the  upper abdomen and left lateral decubitus or further evaluation with CT is recommended. There is degenerative changes of the spine. No acute osseous pathology. IMPRESSION: Air-filled structure in the right hemiabdomen likely represents a distended loop of colon. Clinical correlation and further evaluation with additional radiographs or CT as above. Electronically Signed   By: Anner Crete M.D.   On: 07/12/2019 20:10   DG Chest Port 1 View  Result Date: 07/09/2019 CLINICAL DATA:  Weakness EXAM: PORTABLE CHEST 1 VIEW COMPARISON:  12/29/2017, 01/19/2015 FINDINGS: Mildly low lung volumes. Coarse chronic interstitial opacity. Patchy opacity right infrahilar lung probable atelectasis. Stable cardiomediastinal silhouette. Lucency beneath the right diaphragm presumably due to air distended colon. IMPRESSION: 1. Mild right infrahilar atelectasis or minimal infiltrate. 2. Lucency beneath the right diaphragm suspected to be secondary to prominent air distension of the colon. Electronically Signed   By: Donavan Foil M.D.   On: 07/12/2019 19:26    Pending Labs Unresulted Labs (From admission, onward)    Start     Ordered   06/22/2019 0220  Lactic acid, plasma  ONCE - STAT,   STAT     07/06/2019 0219   06/30/2019 2209  Blood culture (routine x 2)  BLOOD CULTURE X 2,   STAT     07/01/2019 2208   07/09/2019 1851  Urine Culture  Once,   STAT     06/30/2019 1850   Signed and Held  CBC with Differential/Platelet  Tomorrow morning,   R     Signed and Held   Signed and Held  Comprehensive metabolic panel  Tomorrow morning,   R     Signed and Held   Signed and Held  Procalcitonin - Baseline  ONCE - STAT,   STAT     Signed and Held   Signed and Held  Procalcitonin  Daily,   R     Signed and Held          Vitals/Pain Today's Vitals   06/23/2019 2300 07/04/2019 2302 06/27/2019 0154 07/07/2019 0333  BP: (!) 154/76 (!) 154/76 (!) 172/96 129/83  Pulse: 75 78 94 (!)  105  Resp: (!) 21 19 19 19   Temp:      TempSrc:      SpO2:  97% 95% 96% 98%  Weight:      Height:      PainSc:        Isolation Precautions Airborne and Contact precautions  Medications Medications  cefTRIAXone (ROCEPHIN) 1 g in sodium chloride 0.9 % 100 mL IVPB (0 g Intravenous Stopped 06/20/2019 2105)  azithromycin (ZITHROMAX) 500 mg in sodium chloride 0.9 % 250 mL IVPB (0 mg Intravenous Stopped 07/10/2019 2207)  lactated ringers bolus 1,000 mL (0 mLs Intravenous Stopped 06/25/2019 2245)  iohexol (OMNIPAQUE) 9 MG/ML oral solution 500 mL (500 mLs Oral Contrast Given 07/12/2019 2147)  metroNIDAZOLE (FLAGYL) IVPB 500 mg (0 mg Intravenous Stopped 06/22/2019 0058)    Mobility manual wheelchair

## 2019-07-03 NOTE — Subjective & Objective (Signed)
CC: altered mental status HPI: 86 year old African-American male with a history of constipation, combined systolic/diastolic heart failure, hypertension, prior history of CVA presents to the ER with a 1 day history of lethargy.  Patient only give any history review of systems due to altered mental status.  Family is no longer available in the ER.  CT scan demonstrated a severe sigmoid volvulus.  GI was consulted and they have seen the patient in the ER.  GI recommended to proceed with sigmoid decompression but the family want to think about it.  Per the GI note, the family understands there is a risk of perforation possible death.  Patient be admitted the hospital for IV antibiotic therapy, IV fluids while awaiting family to decide on whether to proceed with sigmoid volvulus decompression.

## 2019-07-03 NOTE — Assessment & Plan Note (Signed)
Gentle IVF. Monitor for acute CHF. Known LVEF of 25-30%.

## 2019-07-03 NOTE — Assessment & Plan Note (Addendum)
Repeat lactic acid. Pt given IVF in ER. Gentle IVF. Known LVEF of 25-30%.

## 2019-07-03 NOTE — Progress Notes (Signed)
    Called about this patient by Dr. Anitra Lauth.  Chart and images reviewed.  He has a sigmoid volvulus.  I called family - daughters and son in  Law - Florene Glen and Tyson Alias.  Explained that standard treatment is sigmoidoscopy and decompression to untwiist colon and decrease risk of perforation and peritonitis.  Explained could be done w/o sedation.  He is in no sig distress per Dr. Anitra Lauth.  Family wants to consider this procedure - not ready for him to undergo.  They are aware of potential problems of ischemia/infarct and colon perforation if not done.  I will plan to regroup with them in early AM   Will try a rectal tube at this time.  Iva Boop, MD, [facility] 07-11-19 12:16 AM

## 2019-07-03 NOTE — Assessment & Plan Note (Signed)
See above

## 2019-07-03 NOTE — Assessment & Plan Note (Signed)
Holding all PO meds. Prn IV labetalol.

## 2019-07-03 NOTE — ED Notes (Signed)
Rectal tube inserted per MD order

## 2019-07-03 NOTE — H&P (Signed)
History and Physical    Jack Zuniga WUJ:[id number] DOB: Dec 03, 1919 DOA: 07-11-2019  PCP: Devra Dopp, MD   Patient coming from: Home  I have personally briefly reviewed patient's old medical records in [place] [facility]  CC: altered mental status HPI: 86 year old African-American male with a history of constipation, combined systolic/diastolic heart failure, hypertension, prior history of CVA presents to the ER with a 1 day history of lethargy.  Patient only give any history review of systems due to altered mental status.  Family is no longer available in the ER.  CT scan demonstrated a severe sigmoid volvulus.  GI was consulted and they have seen the patient in the ER.  GI recommended to proceed with sigmoid decompression but the family want to think about it.  Per the GI note, the family understands there is a risk of perforation possible death.  Patient be admitted the hospital for IV antibiotic therapy, IV fluids while awaiting family to decide on whether to proceed with sigmoid volvulus decompression.    ED Course: CT abd shows sigmoid volvulus. GI consulted and has seen patient at bedside.  Review of Systems:  Review of Systems  Unable to perform ROS: Mental status change    Past Medical History:  Diagnosis Date  . Cataracts, both eyes   . CHF (congestive heart failure) (HCC)   . Constipation   . CVA (cerebral vascular accident) (HCC)   . Glaucoma   . Hypertension   . Hypokalemia   . Kidney injury   . Stroke [facility])    2011 in [place], no deficits    History reviewed. No pertinent surgical history.   reports that he has quit smoking. His smoking use included cigars. He quit after 30.00 years of use. He has never used smokeless tobacco. He reports that he does not drink alcohol or use drugs.  No Known Allergies  Family History  Problem Relation Age of Onset  . Stroke Neg Hx   . Kidney disease Neg Hx     Prior to Admission medications   Medication Sig Start Date End  Date Taking? Authorizing Provider  aspirin 81 MG EC tablet Take 1 tablet (81 mg total) by mouth daily. 01/01/18  Yes Barnetta Chapel, MD  carvedilol (COREG) 6.25 MG tablet Take 1 tablet (6.25 mg total) by mouth 2 (two) times daily with a meal. 01/17/19  Yes Jodelle Red, MD  docusate sodium (COLACE) 100 MG capsule Take 1 capsule (100 mg total) by mouth 2 (two) times daily. 01/20/15  Yes Rama, Maryruth Bun, MD  feeding supplement, ENSURE ENLIVE, (ENSURE ENLIVE) LIQD Take 237 mLs by mouth every morning. 01/20/15  Yes Rama, Maryruth Bun, MD  furosemide (LASIX) 40 MG tablet Take 1 tablet (40 mg total) by mouth daily. 01/17/19  Yes Jodelle Red, MD  latanoprost (XALATAN) 0.005 % ophthalmic solution Place 1 drop into both eyes at bedtime. 12/14/17  Yes [provider]  lisinopril (ZESTRIL) 5 MG tablet Take 5 mg by mouth daily.   Yes [provider]  Multiple Vitamins-Minerals (ONE-A-DAY MENS 50+ ADVANTAGE) TABS Take 1 tablet by mouth daily.   Yes [provider]  lisinopril (ZESTRIL) 5 MG tablet Take 1 tablet (5 mg total) by mouth daily. 01/17/19 04/17/19  Jodelle Red, MD    Physical Exam: Vitals:   2019-07-11 2200 07-11-2019 2300 07-11-2019 2302 07/08/2019 0154  BP: (!) 141/87 (!) 154/76 (!) 154/76 (!) 172/96  Pulse:  75 78 94  Resp: (!) 27 (!) 21 19 19  Temp:      TempSrc:      SpO2:  97% 95% 96%  Weight:      Height:        Physical Exam  Nursing note and vitals reviewed. Constitutional: No distress.  Appears younger than stated age of 86 yo.  HENT:  Head: Normocephalic and atraumatic.  Eyes: Right eye exhibits no discharge. Left eye exhibits no discharge.  Neck: No JVD present.  Cardiovascular: Normal rate and regular rhythm.  Respiratory: Effort normal.  GI: He exhibits distension. Bowel sounds are increased. There is abdominal tenderness in the left lower quadrant.  Hyperactive BS. Pt is tympanitic to percussion. Tender in LLQ    Musculoskeletal:        General: No deformity or edema.  Neurological:  Disoriented. Knows his name but not where he is.  Skin: Skin is warm and dry. He is not diaphoretic.     Labs on Admission: I have personally reviewed following labs and imaging studies  CBC: Recent Labs  Lab 06/30/2019 1923  WBC 14.9*  NEUTROABS 12.6*  HGB 10.8*  HCT 35.1*  MCV 76.1*  PLT 542   Basic Metabolic Panel: Recent Labs  Lab 07/04/2019 1923  NA 146*  K 3.5  CL 108  CO2 25  GLUCOSE 131*  BUN 51*  CREATININE 2.25*  CALCIUM 9.2   GFR: Estimated Creatinine Clearance: 17.7 mL/min (A) (by C-G formula based on SCr of 2.25 mg/dL (H)). Liver Function Tests: Recent Labs  Lab 07/12/2019 1923  AST 182*  ALT 189*  ALKPHOS 58  BILITOT 0.6  PROT 7.7  ALBUMIN 3.3*   No results for input(s): LIPASE, AMYLASE in the last 168 hours. No results for input(s): AMMONIA in the last 168 hours. Coagulation Profile: No results for input(s): INR, PROTIME in the last 168 hours. Cardiac Enzymes: No results for input(s): CKTOTAL, CKMB, CKMBINDEX, TROPONINI in the last 168 hours. BNP (last 3 results) No results for input(s): PROBNP in the last 8760 hours. HbA1C: No results for input(s): HGBA1C in the last 72 hours. CBG: No results for input(s): GLUCAP in the last 168 hours. Lipid Profile: No results for input(s): CHOL, HDL, LDLCALC, TRIG, CHOLHDL, LDLDIRECT in the last 72 hours. Thyroid Function Tests: No results for input(s): TSH, T4TOTAL, FREET4, T3FREE, THYROIDAB in the last 72 hours. Anemia Panel: No results for input(s): VITAMINB12, FOLATE, FERRITIN, TIBC, IRON, RETICCTPCT in the last 72 hours. Urine analysis:    Component Value Date/Time   COLORURINE YELLOW 06/19/2019 1921   APPEARANCEUR HAZY (A) 06/16/2019 1921   LABSPEC 1.014 07/16/2019 1921   PHURINE 5.0 06/16/2019 1921   GLUCOSEU NEGATIVE 07/08/2019 1921   HGBUR MODERATE (A) 06/26/2019 1921   BILIRUBINUR NEGATIVE 06/22/2019 [place] NEGATIVE 07/04/2019 1921   PROTEINUR 100 (A) 07/01/2019 1921   UROBILINOGEN 1.0 01/16/2015 0214   NITRITE NEGATIVE 07/06/2019 1921   LEUKOCYTESUR NEGATIVE 07/15/2019 1921    Radiological Exams on Admission: I have personally reviewed images CT ABDOMEN PELVIS WO CONTRAST  Addendum Date: 07/04/2019   ADDENDUM REPORT: 07/07/2019 23:51 ADDENDUM: The results worrisome for malignancy or metastasis will be called to the post-operative clinician or representative tomorrow morning by the Radiologist Assistant, and communication documented in the PACS or [employer]. Electronically Signed   By: Revonda Humphrey   On: 06/30/2019 23:51   Result Date: 07/15/2019 CLINICAL DATA:  Abdominal distension.  Altered mental status. EXAM: CT ABDOMEN AND PELVIS WITHOUT CONTRAST TECHNIQUE: Multidetector CT imaging of the abdomen  and pelvis was performed following the standard protocol without IV contrast. COMPARISON:  January 17, 2015 FINDINGS: Lower chest: Moderate cardiomegaly with physiologic amount of pericardial fluid. Moderate to severe LAD, right main and left circumflex coronary calcification. Aortic valvular calcification. Patulous distal esophagus. Mild bronchiectasis with probable aspiration in the right middle lobe and lingula. Bilateral bronchitis. Suspicious 18 x 13 mm solid nodule noncalcified in the right middle lobe, series 3, image 7. Additional smaller sub solid and mixed nodularities in the same region could be present given the patchy consolidation and alveolar disease in both lungs. Mild calcified atherosclerosis and 3.5 cm diameter of the descending thoracic aorta. Hepatobiliary: Arm and telemetry leads placement markedly degrade evaluation of the vessel parenchyma. Normal hepatic size. Pancreas: Mild atrophy.  No acute pancreatitis. Spleen: Small spleen with normal splenic contours measuring 7.4 x 2.0 cm. Adrenals/Urinary Tract: Abnormal urinary bladder trabeculation and mild diffuse wall  thickening with small bladder diverticula better probably postobstructive from prostatic hyperplasia. Bilateral medical renal disease. No hydronephrosis for nephroureteral calculus. No apparent abnormality of the adrenal glands. Stomach/Bowel: Decompressed gastric and small bowel lumens without obstruction or apparent mucosal thickening. Abnormal 12 cm gaseous distension of a redundant sigmoid colon in the right abdomen with 360 degree counterclockwise volvulus of the efferent loop. No evidence of bowel wall thickening or pneumatosis coli. A small amount of stool in the rectum. Mild gaseous distension of the more proximal colon, which is displaced towards the left abdomen. Normal appendix, coronal image 43. Vascular/Lymphatic: Aorto bi-iliac calcified atherosclerosis without an abdominal aortic aneurysm. Nonspecific bilateral inguinal lymph nodes. An enlarged right inguinal 11 Hounsfield unit, 17 x 20 mm fluid collection with lobulated contours. Reproductive: Massive prostatomegaly measuring 7.6 cm diameter with mass effect on the posterior urinary bladder. Other: Trace 6 Hounsfield unit free fluid in the dependent right hemipelvis. The Musculoskeletal: Diffuse permeative bone pattern and moderate degenerative changes. A focal fat-density lytic lesion in the right iliac crest, likely benign and possibly an hemangioma. Critical Value/emergent results were called by telephone at the time of interpretation on 07/04/2019 at 11:06 pm to provider DR. Gwyneth Sprout , who verbally acknowledged these results. IMPRESSION: Acute abdomen with sigmoid volvulus. Emergent surgical consultation is indicated. No apparent bowel ischemia or pneumatosis. A suspicious solid right middle lobe pulmonary nodule, highly worrisome for a primary bronchopulmonary carcinoma or metastatic lesion to the lung. Further evaluation with 18- fluorine FDG PET CT or histopathological sampling could be considered. Moderate cardiomegaly and moderate to  severe LAD coronary calcification with bilateral bronchitis and bronchiectasis, and patchy airspace and alveolar disease in both lungs concerning for possible aspiration-pneumonitis. An additional underlying pulmonary nodularity could also be present. Massive prostatomegaly and urinary bladder findings concerning for urinary outlet obstruction. Abnormal permeative bone marrow pattern, possibly secondary to aggressive osteopenia or a benign or malignant marrow infiltrating disease. Aortic calcified atherosclerosis and 3.5 cm ectatic caliber of the descending thoracic aorta. A 17 x 20 mm 11 Hounsfield unit lobular density in the right inguinal canal, probably a hernia repair plug. Correlation with surgical history recommended. Electronically Signed: By: Laurence Ferrari On: 2019/07/04 23:29   DG Abdomen 1 View  Result Date: 07/04/2019 CLINICAL DATA:  86 year old male with prominent air noted in the chest x-ray. EXAM: ABDOMEN - 1 VIEW COMPARISON:  Chest radiograph dated 07/04/19 abdominal radiograph dated 01/17/2015 and CT abdomen pelvis dated 01/17/2015. FINDINGS: Evaluation is limited as the upper abdomen is not included in the images. There is a large gas-filled structure in the  right hemiabdomen which is suboptimally evaluated. The pattern of air likely represents a distended loop of colon in this patient with history of prior colonic ileus. Pneumoperitoneum is much less likely. Repeat radiograph with inclusion of the upper abdomen and left lateral decubitus or further evaluation with CT is recommended. There is degenerative changes of the spine. No acute osseous pathology. IMPRESSION: Air-filled structure in the right hemiabdomen likely represents a distended loop of colon. Clinical correlation and further evaluation with additional radiographs or CT as above. Electronically Signed   By: Elgie Collard M.D.   On: Jul 23, 2019 20:10   DG Chest Port 1 View  Result Date: 2019-07-23 CLINICAL DATA:  Weakness  EXAM: PORTABLE CHEST 1 VIEW COMPARISON:  12/29/2017, 01/19/2015 FINDINGS: Mildly low lung volumes. Coarse chronic interstitial opacity. Patchy opacity right infrahilar lung probable atelectasis. Stable cardiomediastinal silhouette. Lucency beneath the right diaphragm presumably due to air distended colon. IMPRESSION: 1. Mild right infrahilar atelectasis or minimal infiltrate. 2. Lucency beneath the right diaphragm suspected to be secondary to prominent air distension of the colon. Electronically Signed   By: Jasmine Pang M.D.   On: Jul 23, 2019 19:26    EKG: I have personally reviewed EKG: ectopic atrial rhythm. Inverted p-waves in II    Assessment/Plan Principal Problem:   Sigmoid volvulus (HCC) Active Problems:   Sepsis with acute organ dysfunction without septic shock (HCC)   Dehydration   AKI (acute kidney injury) (HCC)   Essential hypertension   DNR (do not resuscitate)/DNI(Do Not intubate)    Sigmoid volvulus (HCC) Admit to inpatient. IV abx with zosyn. GI has seen patient. Awaiting for family to decide on how to proceed. If they decide not proceed with decompression, the patient's volvulus will likely cause his demise.  Sepsis with acute organ dysfunction without septic shock (HCC) Repeat lactic acid. Pt given IVF in ER. Gentle IVF. Known LVEF of 25-30%.  Dehydration Gentle IVF. Monitor for acute CHF. Known LVEF of 25-30%.  AKI (acute kidney injury) (HCC) See above.  DNR (do not resuscitate)/DNI(Do Not intubate) PCP office note from 03-28-2018 noted. Pt is a DNR/DNI.  Essential hypertension Holding all PO meds. Prn IV labetalol.   DVT prophylaxis: SCDs Code Status: DNR/DNI(Do NOT Intubate) Family Communication: GI and ER provider have communicated with familiy  Disposition Plan: to be determined  Consults called: GI([place])  Admission status: Inpatient, Med-Surg   Carollee Herter, DO [facility] 06/27/2019, 2:36 AM

## 2019-07-03 NOTE — Consult Note (Signed)
Consultation  Referring Provider:     ED/TRH Primary Care Physician:  Devra Dopp, MD Primary Gastroenterologist:        unassigned Reason for Consultation:    volvulus      Impression / Plan:   Sigmoid volvulus DNR/DNI Suspicious lung lesion Altered mental status ? Dementia Aspiration pneumonia suspected Sepsis? Lactate 2.7  See prior note also - family Jack Zuniga, Jack Zuniga - agreeable to decompressive sigmoidoscopy as of now - informed consent discussion has taken place  He is distended - comfortable - but appropriate Tx is decompression  Staff working on an IV currently - poor access - though may not sedate him for procedure would be best to have an IV working regardless  I have explained that we can do w/o sedation but might need to give him some to make him comfortable and though cannot guarantee no problems very unlikely with small dose of sedation  I have also explained that this is a temporary solution and that recurrence is likely. Did not discuss details of a permanent solution I.e. surgery beyond surgery is usually required.  Will check KUB  Anticipate will need decompressive sigmoidoscopy today - long term plan ?   Iva Boop, MD, [facility] 07/04/2019 6:50 AM          HPI:   Jack Zuniga is a 86 y.o. male admitted overnight with altered mental status, suspected aspiration pneumonia and sigmoid volvulus. He has been awake and comfortable. I had spoken with family early this AM and they wanted to think about whether or not to have decompressive sigmoidoscopy. He is not aware of his location other than hospital now. No IV yet - staff attempting again. A rectal tube was placed.  2016 admit had CT for abd distention no volvulus  Past Medical History:  Diagnosis Date  . Cataracts, both eyes   . CHF (congestive heart failure) (HCC)   . Constipation   . CVA (cerebral vascular accident) (HCC)   . Glaucoma   .  Hypertension   . Hypokalemia   . Kidney injury   . Stroke [facility])    2011 in [place], no deficits    History reviewed. No pertinent surgical history. ? Hernia repair Family History  Problem Relation Age of Onset  . Stroke Neg Hx   . Kidney disease Neg Hx    Social History   Tobacco Use  . Smoking status: Former Smoker    Years: 30.00    Types: Cigars  . Smokeless tobacco: Never Used  Substance Use Topics  . Alcohol use: No  . Drug use: No    Prior to Admission medications   Medication Sig Start Date End Date Taking? Authorizing Provider  aspirin 81 MG EC tablet Take 1 tablet (81 mg total) by mouth daily. 01/01/18  Yes Barnetta Chapel, MD  carvedilol (COREG) 6.25 MG tablet Take 1 tablet (6.25 mg total) by mouth 2 (two) times daily with a meal. 01/17/19  Yes Jodelle Red, MD  docusate sodium (COLACE) 100 MG capsule Take 1 capsule (100 mg total) by mouth 2 (two) times daily. 01/20/15  Yes Rama, Maryruth Bun, MD  feeding supplement, ENSURE ENLIVE, (ENSURE ENLIVE) LIQD Take 237 mLs by mouth every morning. 01/20/15  Yes Rama, Maryruth Bun, MD  furosemide (LASIX) 40 MG tablet Take 1 tablet (40 mg total) by mouth daily. 01/17/19  Yes Jodelle Red, MD  latanoprost (XALATAN) 0.005 % ophthalmic solution Place 1 drop into both  eyes at bedtime. 12/14/17  Yes [provider]  lisinopril (ZESTRIL) 5 MG tablet Take 5 mg by mouth daily.   Yes [provider]  Multiple Vitamins-Minerals (ONE-A-DAY MENS 50+ ADVANTAGE) TABS Take 1 tablet by mouth daily.   Yes [provider]  lisinopril (ZESTRIL) 5 MG tablet Take 1 tablet (5 mg total) by mouth daily. 01/17/19 04/17/19  Jodelle Red, MD    Current Facility-Administered Medications  Medication Dose Route Frequency Provider Last Rate Last Admin  . labetalol (NORMODYNE) injection 10 mg  10 mg Intravenous Q4H PRN Carollee Herter, DO      . lactated ringers infusion   Intravenous Continuous Carollee Herter, DO        . morphine 2 MG/ML injection 2 mg  2 mg Intravenous Q2H PRN Carollee Herter, DO      . ondansetron [facility]) tablet 4 mg  4 mg Oral Q6H PRN Carollee Herter, DO       Or  . ondansetron [facility]) injection 4 mg  4 mg Intravenous Q6H PRN Carollee Herter, DO      . piperacillin-tazobactam (ZOSYN) IVPB 2.25 g  2.25 g Intravenous Q8H Carollee Herter, DO        Allergies as of 07/16/2019  . (No Known Allergies)     Review of Systems:     unobtainable      Physical Exam:  Vital signs in last 24 hours: Temp:  [97.8 F (36.6 C)-98.2 F (36.8 C)] 97.8 F (36.6 C) (03/18 0400) Pulse Rate:  [75-105] 92 (03/18 0400) Resp:  [19-27] 20 (03/18 0400) BP: (121-172)/(71-96) 156/93 (03/18 0400) SpO2:  [93 %-98 %] 93 % (03/18 0400) Weight:  [70.9 kg-75.8 kg] 70.9 kg (03/18 0417)    Elderly bm NAD Awake alert but not oriented Abdomen very distended, can depress the wall some, not tense, BS decreased, not tender on my exam  Data Reviewed:   LAB RESULTS: Recent Labs    07/01/2019 1923  WBC 14.9*  HGB 10.8*  HCT 35.1*  PLT 199   BMET Recent Labs    06/29/2019 1923  NA 146*  K 3.5  CL 108  CO2 25  GLUCOSE 131*  BUN 51*  CREATININE 2.25*  CALCIUM 9.2   LFT Recent Labs    07/13/2019 1923  PROT 7.7  ALBUMIN 3.3*  AST 182*  ALT 189*  ALKPHOS 58  BILITOT 0.6   PT/INR No results for input(s): LABPROT, INR in the last 72 hours.  CT scan images personally viewed STUDIES: CT ABDOMEN PELVIS WO CONTRAST  Addendum Date: 07/11/2019   ADDENDUM REPORT: 06/18/2019 23:51 ADDENDUM: The results worrisome for malignancy or metastasis will be called to the post-operative clinician or representative tomorrow morning by the Radiologist Assistant, and communication documented in the PACS or [employer]. Electronically Signed   By: Laurence Ferrari   On: 07/06/2019 23:51   Result Date: 06/21/2019 CLINICAL DATA:  Abdominal distension.  Altered mental status. EXAM: CT ABDOMEN AND PELVIS WITHOUT CONTRAST  TECHNIQUE: Multidetector CT imaging of the abdomen and pelvis was performed following the standard protocol without IV contrast. COMPARISON:  January 17, 2015 FINDINGS: Lower chest: Moderate cardiomegaly with physiologic amount of pericardial fluid. Moderate to severe LAD, right main and left circumflex coronary calcification. Aortic valvular calcification. Patulous distal esophagus. Mild bronchiectasis with probable aspiration in the right middle lobe and lingula. Bilateral bronchitis. Suspicious 18 x 13 mm solid nodule noncalcified in the right middle lobe, series 3, image 7. Additional smaller sub solid and  mixed nodularities in the same region could be present given the patchy consolidation and alveolar disease in both lungs. Mild calcified atherosclerosis and 3.5 cm diameter of the descending thoracic aorta. Hepatobiliary: Arm and telemetry leads placement markedly degrade evaluation of the vessel parenchyma. Normal hepatic size. Pancreas: Mild atrophy.  No acute pancreatitis. Spleen: Small spleen with normal splenic contours measuring 7.4 x 2.0 cm. Adrenals/Urinary Tract: Abnormal urinary bladder trabeculation and mild diffuse wall thickening with small bladder diverticula better probably postobstructive from prostatic hyperplasia. Bilateral medical renal disease. No hydronephrosis for nephroureteral calculus. No apparent abnormality of the adrenal glands. Stomach/Bowel: Decompressed gastric and small bowel lumens without obstruction or apparent mucosal thickening. Abnormal 12 cm gaseous distension of a redundant sigmoid colon in the right abdomen with 360 degree counterclockwise volvulus of the efferent loop. No evidence of bowel wall thickening or pneumatosis coli. A small amount of stool in the rectum. Mild gaseous distension of the more proximal colon, which is displaced towards the left abdomen. Normal appendix, coronal image 43. Vascular/Lymphatic: Aorto bi-iliac calcified atherosclerosis without an  abdominal aortic aneurysm. Nonspecific bilateral inguinal lymph nodes. An enlarged right inguinal 11 Hounsfield unit, 17 x 20 mm fluid collection with lobulated contours. Reproductive: Massive prostatomegaly measuring 7.6 cm diameter with mass effect on the posterior urinary bladder. Other: Trace 6 Hounsfield unit free fluid in the dependent right hemipelvis. The Musculoskeletal: Diffuse permeative bone pattern and moderate degenerative changes. A focal fat-density lytic lesion in the right iliac crest, likely benign and possibly an hemangioma. Critical Value/emergent results were called by telephone at the time of interpretation on 07-22-19 at 11:06 pm to provider DR. Blanchie Dessert , who verbally acknowledged these results. IMPRESSION: Acute abdomen with sigmoid volvulus. Emergent surgical consultation is indicated. No apparent bowel ischemia or pneumatosis. A suspicious solid right middle lobe pulmonary nodule, highly worrisome for a primary bronchopulmonary carcinoma or metastatic lesion to the lung. Further evaluation with 18- fluorine FDG PET CT or histopathological sampling could be considered. Moderate cardiomegaly and moderate to severe LAD coronary calcification with bilateral bronchitis and bronchiectasis, and patchy airspace and alveolar disease in both lungs concerning for possible aspiration-pneumonitis. An additional underlying pulmonary nodularity could also be present. Massive prostatomegaly and urinary bladder findings concerning for urinary outlet obstruction. Abnormal permeative bone marrow pattern, possibly secondary to aggressive osteopenia or a benign or malignant marrow infiltrating disease. Aortic calcified atherosclerosis and 3.5 cm ectatic caliber of the descending thoracic aorta. A 17 x 20 mm 11 Hounsfield unit lobular density in the right inguinal canal, probably a hernia repair plug. Correlation with surgical history recommended. Electronically Signed: By: Revonda Humphrey On:  07-22-19 23:29   DG Abdomen 1 View  Result Date: 2019-07-22 CLINICAL DATA:  86 year old male with prominent air noted in the chest x-ray. EXAM: ABDOMEN - 1 VIEW COMPARISON:  Chest radiograph dated 2019/07/22 abdominal radiograph dated 01/17/2015 and CT abdomen pelvis dated 01/17/2015. FINDINGS: Evaluation is limited as the upper abdomen is not included in the images. There is a large gas-filled structure in the right hemiabdomen which is suboptimally evaluated. The pattern of air likely represents a distended loop of colon in this patient with history of prior colonic ileus. Pneumoperitoneum is much less likely. Repeat radiograph with inclusion of the upper abdomen and left lateral decubitus or further evaluation with CT is recommended. There is degenerative changes of the spine. No acute osseous pathology. IMPRESSION: Air-filled structure in the right hemiabdomen likely represents a distended loop of colon. Clinical correlation and further evaluation  with additional radiographs or CT as above. Electronically Signed   By: Elgie Collard M.D.   On: 07/31/19 20:10   DG Chest Port 1 View  Result Date: 07-31-2019 CLINICAL DATA:  Weakness EXAM: PORTABLE CHEST 1 VIEW COMPARISON:  12/29/2017, 01/19/2015 FINDINGS: Mildly low lung volumes. Coarse chronic interstitial opacity. Patchy opacity right infrahilar lung probable atelectasis. Stable cardiomediastinal silhouette. Lucency beneath the right diaphragm presumably due to air distended colon. IMPRESSION: 1. Mild right infrahilar atelectasis or minimal infiltrate. 2. Lucency beneath the right diaphragm suspected to be secondary to prominent air distension of the colon. Electronically Signed   By: Jasmine Pang M.D.   On: 07-31-2019 19:26      Thanks   LOS: 0 days   @[place]erry [place]  , MD, [facility] @  07/07/2019, 6:43 AM

## 2019-07-03 NOTE — Plan of Care (Signed)
  Problem: Clinical Measurements: Goal: Diagnostic test results will improve Outcome: Progressing   Problem: Activity: Goal: Risk for activity intolerance will decrease Outcome: Progressing   Problem: Pain Managment: Goal: General experience of comfort will improve Outcome: Progressing   Problem: Safety: Goal: Ability to remain free from injury will improve Outcome: Progressing   

## 2019-07-03 NOTE — Assessment & Plan Note (Signed)
PCP office note from 03-28-2018 noted. Pt is a DNR/DNI.

## 2019-07-03 NOTE — Assessment & Plan Note (Signed)
Admit to inpatient. IV abx with zosyn. GI has seen patient. Awaiting for family to decide on how to proceed. If they decide not proceed with decompression, the patient's volvulus will likely cause his demise.

## 2019-07-03 NOTE — Progress Notes (Signed)
PROGRESS NOTE    Jack Zuniga  QQP:[id number] DOB: 1919/12/27 DOA:  PCP: Helane Rima, MD   Brief Narrative: Jack Zuniga is a 86 y.o. male with a history of constipation, combined systolic/diastolic heart failure, hypertension, prior history of CVA. Patient presented secondary to lethargy and found to have a sigmoid volvulus. GI consulted for decompression.   Assessment & Plan:   Principal Problem:   Sigmoid volvulus ([place]) Active Problems:   Dehydration   Essential hypertension   DNR (do not resuscitate)/DNI(Do Not intubate)   AKI (acute kidney injury) ([place])   Sepsis with acute organ dysfunction without septic shock ([place])   Sigmoid volvulus Patient is not symptomatic at this time. GI consulted with recommendations for decompression. -GI recommendations: decompression today  Sepsis Ruled out. Likely not sepsis but rather dehydration and inflammation from volvulus. Do not suspect any infectious source.  Dehydration IV fluids initiated.  Chronic combined systolic and diastolic heart failure Last EF of 25-30% from Transthoracic Echocardiogram in 2020. Patient is on Coreg, Lasix and lisinopril as an outpatient. Hypovolemic on admission -Restart Coreg once taking PO; will hold lasix and lisinopril secondary to AKI  AKI on CKD stage IIIa Baseline creatinine of 1.3. Creatinine on admission of 2.25. Trending down slightly with IV fluids. In setting of lisinopril and Lasix use.  Essential hypertension Patient is on lisinopril and Coreg as an outpatient   DVT prophylaxis: SCDs Code Status:   Code Status: DNR Family Communication: None at bedside Disposition Plan: Discharge pending GI recommendations   Consultants:   [facility]  Procedures:   None  Antimicrobials:  Zosyn    Subjective: No concerns today. No abdominal pain.  Objective: Vitals:   2019/07/20 0333 2019-07-20 0400 07-20-2019 0417 Jul 20, 2019 1407  BP: 129/83 (!) 156/93  (!) 167/79  Pulse: (!)  105 92    Resp: 19 20  20   Temp:  97.8 F (36.6 C)  (!) 96.3 F (35.7 C)  TempSrc:  Oral  Temporal  SpO2: 98% 93%  93%  Weight:   70.9 kg 70.9 kg  Height:   5\' 8"  (1.727 m) 5\' 8"  (1.727 m)   No intake or output data in the 24 hours ending 2019/07/20 1446 Filed Weights   06/21/2019 1822 Jul 20, 2019 0417 2019/07/20 1407  Weight: 75.8 kg 70.9 kg 70.9 kg    Examination:  General exam: Appears calm and comfortable Respiratory system: Coarse breath sounds. Respiratory effort normal. Cardiovascular system: S1 & S2 heard, RRR. No murmurs, rubs, gallops or clicks. Gastrointestinal system: Abdomen is distended, soft and nontender. Normal bowel sounds heard. Central nervous system: Alert and oriented to person. Not oriented to situation. Extremities: No edema. No calf tenderness Skin: No cyanosis. No rashes Psychiatry: Judgement and insight appear impaired.    Data Reviewed: I have personally reviewed following labs and imaging studies  CBC: Recent Labs  Lab 06/27/2019 1923 07-20-19 0653  WBC 14.9* 13.2*  NEUTROABS 12.6* 11.3*  HGB 10.8* 11.7*  HCT 35.1* 37.5*  MCV 76.1* 77.5*  PLT 199 712   Basic Metabolic Panel: Recent Labs  Lab 06/26/2019 1923 07/20/2019 0653  NA 146* 144  K 3.5 4.1  CL 108 107  CO2 25 25  GLUCOSE 131* 110*  BUN 51* 53*  CREATININE 2.25* 2.04*  CALCIUM 9.2 9.3   GFR: Estimated Creatinine Clearance: 19.6 mL/min (A) (by C-G formula based on SCr of 2.04 mg/dL (H)). Liver Function Tests: Recent Labs  Lab 06/19/2019 1923 Jul 20, 2019 0653  AST 182* 134*  ALT 189*  164*  ALKPHOS 58 62  BILITOT 0.6 1.0  PROT 7.7 8.5*  ALBUMIN 3.3* 3.5   No results for input(s): LIPASE, AMYLASE in the last 168 hours. No results for input(s): AMMONIA in the last 168 hours. Coagulation Profile: No results for input(s): INR, PROTIME in the last 168 hours. Cardiac Enzymes: No results for input(s): CKTOTAL, CKMB, CKMBINDEX, TROPONINI in the last 168 hours. BNP (last 3 results) No  results for input(s): PROBNP in the last 8760 hours. HbA1C: No results for input(s): HGBA1C in the last 72 hours. CBG: No results for input(s): GLUCAP in the last 168 hours. Lipid Profile: No results for input(s): CHOL, HDL, LDLCALC, TRIG, CHOLHDL, LDLDIRECT in the last 72 hours. Thyroid Function Tests: No results for input(s): TSH, T4TOTAL, FREET4, T3FREE, THYROIDAB in the last 72 hours. Anemia Panel: No results for input(s): VITAMINB12, FOLATE, FERRITIN, TIBC, IRON, RETICCTPCT in the last 72 hours. Sepsis Labs: Recent Labs  Lab 07/06/2019 1923 07/01/2019 0653  PROCALCITON  --  5.24  LATICACIDVEN 2.7* 2.0*    Recent Results (from the past 240 hour(s))  Respiratory Panel by RT PCR (Flu A&B, Covid) - Nasopharyngeal Swab     Status: None   Collection Time: 06/28/2019 11:40 PM   Specimen: Nasopharyngeal Swab  Result Value Ref Range Status   SARS Coronavirus 2 by RT PCR NEGATIVE NEGATIVE Final    Comment: (NOTE) SARS-CoV-2 target nucleic acids are NOT DETECTED. The SARS-CoV-2 RNA is generally detectable in upper respiratoy specimens during the acute phase of infection. The lowest concentration of SARS-CoV-2 viral copies this assay can detect is 131 copies/mL. A negative result does not preclude SARS-Cov-2 infection and should not be used as the sole basis for treatment or other patient management decisions. A negative result may occur with  improper specimen collection/handling, submission of specimen other than nasopharyngeal swab, presence of viral mutation(s) within the areas targeted by this assay, and inadequate number of viral copies (<131 copies/mL). A negative result must be combined with clinical observations, patient history, and epidemiological information. The expected result is Negative. Fact Sheet for Patients:  https://www.moore.com/ Fact Sheet for Healthcare Providers:  https://www.young.biz/ This test is not yet ap proved or  cleared by the [place] FDA and  has been authorized for detection and/or diagnosis of SARS-CoV-2 by FDA under an Emergency Use Authorization (EUA). This EUA will remain  in effect (meaning this test can be used) for the duration of the COVID-19 declaration under Section 564(b)(1) of the Act, 21 U.S.C. section 360bbb-3(b)(1), unless the authorization is terminated or revoked sooner.    Influenza A by PCR NEGATIVE NEGATIVE Final   Influenza B by PCR NEGATIVE NEGATIVE Final    Comment: (NOTE) The Xpert Xpress SARS-CoV-2/FLU/RSV assay is intended as an aid in  the diagnosis of influenza from Nasopharyngeal swab specimens and  should not be used as a sole basis for treatment. Nasal washings and  aspirates are unacceptable for Xpert Xpress SARS-CoV-2/FLU/RSV  testing. Fact Sheet for Patients: https://www.moore.com/ Fact Sheet for Healthcare Providers: https://www.young.biz/ This test is not yet approved or cleared by the [place] FDA and  has been authorized for detection and/or diagnosis of SARS-CoV-2 by  FDA under an Emergency Use Authorization (EUA). This EUA will remain  in effect (meaning this test can be used) for the duration of the  Covid-19 declaration under Section 564(b)(1) of the Act, 21  U.S.C. section 360bbb-3(b)(1), unless the authorization is  terminated or revoked. Performed at [facility], [address] [place], [place] [address]          Radiology Studies: CT ABDOMEN PELVIS WO CONTRAST  Addendum Date: 06/21/2019   ADDENDUM REPORT: 06/25/2019 23:51 ADDENDUM: The results worrisome for malignancy or metastasis will be called to the post-operative clinician or representative tomorrow morning by the Radiologist Assistant, and communication documented in the PACS or [employer]. Electronically Signed   By: Laurence Ferrari   On: 07/13/2019 23:51   Result Date: 06/28/2019 CLINICAL DATA:  Abdominal  distension.  Altered mental status. EXAM: CT ABDOMEN AND PELVIS WITHOUT CONTRAST TECHNIQUE: Multidetector CT imaging of the abdomen and pelvis was performed following the standard protocol without IV contrast. COMPARISON:  January 17, 2015 FINDINGS: Lower chest: Moderate cardiomegaly with physiologic amount of pericardial fluid. Moderate to severe LAD, right main and left circumflex coronary calcification. Aortic valvular calcification. Patulous distal esophagus. Mild bronchiectasis with probable aspiration in the right middle lobe and lingula. Bilateral bronchitis. Suspicious 18 x 13 mm solid nodule noncalcified in the right middle lobe, series 3, image 7. Additional smaller sub solid and mixed nodularities in the same region could be present given the patchy consolidation and alveolar disease in both lungs. Mild calcified atherosclerosis and 3.5 cm diameter of the descending thoracic aorta. Hepatobiliary: Arm and telemetry leads placement markedly degrade evaluation of the vessel parenchyma. Normal hepatic size. Pancreas: Mild atrophy.  No acute pancreatitis. Spleen: Small spleen with normal splenic contours measuring 7.4 x 2.0 cm. Adrenals/Urinary Tract: Abnormal urinary bladder trabeculation and mild diffuse wall thickening with small bladder diverticula better probably postobstructive from prostatic hyperplasia. Bilateral medical renal disease. No hydronephrosis for nephroureteral calculus. No apparent abnormality of the adrenal glands. Stomach/Bowel: Decompressed gastric and small bowel lumens without obstruction or apparent mucosal thickening. Abnormal 12 cm gaseous distension of a redundant sigmoid colon in the right abdomen with 360 degree counterclockwise volvulus of the efferent loop. No evidence of bowel wall thickening or pneumatosis coli. A small amount of stool in the rectum. Mild gaseous distension of the more proximal colon, which is displaced towards the left abdomen. Normal appendix, coronal image  43. Vascular/Lymphatic: Aorto bi-iliac calcified atherosclerosis without an abdominal aortic aneurysm. Nonspecific bilateral inguinal lymph nodes. An enlarged right inguinal 11 Hounsfield unit, 17 x 20 mm fluid collection with lobulated contours. Reproductive: Massive prostatomegaly measuring 7.6 cm diameter with mass effect on the posterior urinary bladder. Other: Trace 6 Hounsfield unit free fluid in the dependent right hemipelvis. The Musculoskeletal: Diffuse permeative bone pattern and moderate degenerative changes. A focal fat-density lytic lesion in the right iliac crest, likely benign and possibly an hemangioma. Critical Value/emergent results were called by telephone at the time of interpretation on 06/20/2019 at 11:06 pm to provider DR. Gwyneth Sprout , who verbally acknowledged these results. IMPRESSION: Acute abdomen with sigmoid volvulus. Emergent surgical consultation is indicated. No apparent bowel ischemia or pneumatosis. A suspicious solid right middle lobe pulmonary nodule, highly worrisome for a primary bronchopulmonary carcinoma or metastatic lesion to the lung. Further evaluation with 18- fluorine FDG PET CT or histopathological sampling could be considered. Moderate cardiomegaly and moderate to severe LAD coronary calcification with bilateral bronchitis and bronchiectasis, and patchy airspace and alveolar disease in both lungs concerning for possible aspiration-pneumonitis. An additional underlying pulmonary nodularity could also be present. Massive prostatomegaly and urinary bladder findings concerning for urinary outlet obstruction. Abnormal permeative bone marrow pattern, possibly secondary to aggressive osteopenia or a benign or malignant marrow infiltrating disease. Aortic calcified atherosclerosis and 3.5 cm ectatic caliber of  the descending thoracic aorta. A 17 x 20 mm 11 Hounsfield unit lobular density in the right inguinal canal, probably a hernia repair plug. Correlation with surgical  history recommended. Electronically Signed: By: Laurence Ferrari On: 07/13/2019 23:29   DG Abdomen 1 View  Result Date: 06/17/2019 CLINICAL DATA:  86 year old male with prominent air noted in the chest x-ray. EXAM: ABDOMEN - 1 VIEW COMPARISON:  Chest radiograph dated 06/28/2019 abdominal radiograph dated 01/17/2015 and CT abdomen pelvis dated 01/17/2015. FINDINGS: Evaluation is limited as the upper abdomen is not included in the images. There is a large gas-filled structure in the right hemiabdomen which is suboptimally evaluated. The pattern of air likely represents a distended loop of colon in this patient with history of prior colonic ileus. Pneumoperitoneum is much less likely. Repeat radiograph with inclusion of the upper abdomen and left lateral decubitus or further evaluation with CT is recommended. There is degenerative changes of the spine. No acute osseous pathology. IMPRESSION: Air-filled structure in the right hemiabdomen likely represents a distended loop of colon. Clinical correlation and further evaluation with additional radiographs or CT as above. Electronically Signed   By: Elgie Collard M.D.   On: 06/26/2019 20:10   DG Chest Port 1 View  Result Date: 06/26/2019 CLINICAL DATA:  Weakness EXAM: PORTABLE CHEST 1 VIEW COMPARISON:  12/29/2017, 01/19/2015 FINDINGS: Mildly low lung volumes. Coarse chronic interstitial opacity. Patchy opacity right infrahilar lung probable atelectasis. Stable cardiomediastinal silhouette. Lucency beneath the right diaphragm presumably due to air distended colon. IMPRESSION: 1. Mild right infrahilar atelectasis or minimal infiltrate. 2. Lucency beneath the right diaphragm suspected to be secondary to prominent air distension of the colon. Electronically Signed   By: Jasmine Pang M.D.   On: 07/12/2019 19:26   DG Abd Portable 1V  Result Date: 07/12/2019 CLINICAL DATA:  Sigmoid volvulus.  Follow-up. EXAM: PORTABLE ABDOMEN - 1 VIEW COMPARISON:   FINDINGS:  Marked gaseous distention of the colon again noted, unchanged. Rectal tube is seen in place. No visible free air organomegaly. IMPRESSION: Marked colonic gaseous distention with rectal tube in place. No significant change in overall gaseous distention. Electronically Signed   By: Charlett Nose M.D.   On: 06/22/2019 08:43        Scheduled Meds: Continuous Infusions: . sodium chloride    . sodium chloride 500 mL (07/12/2019 1423)  . lactated ringers    . [MAR Hold] piperacillin-tazobactam (ZOSYN)  IV       LOS: 0 days     Jacquelin Hawking, MD [facility] , 2:46 PM  If 7PM-7AM, please contact night-coverage [email or web address]

## 2019-07-04 ENCOUNTER — Inpatient Hospital Stay (HOSPITAL_COMMUNITY): Payer: Medicare PPO

## 2019-07-04 ENCOUNTER — Encounter: Payer: Self-pay | Admitting: *Deleted

## 2019-07-04 DIAGNOSIS — I10 Essential (primary) hypertension: Secondary | ICD-10-CM

## 2019-07-04 DIAGNOSIS — R0989 Other specified symptoms and signs involving the circulatory and respiratory systems: Secondary | ICD-10-CM

## 2019-07-04 DIAGNOSIS — I5043 Acute on chronic combined systolic (congestive) and diastolic (congestive) heart failure: Secondary | ICD-10-CM

## 2019-07-04 LAB — PROCALCITONIN: Procalcitonin: 3.33 ng/mL

## 2019-07-04 LAB — BASIC METABOLIC PANEL
Anion gap: 13 (ref 5–15)
BUN: 72 mg/dL — ABNORMAL HIGH (ref 8–23)
CO2: 25 mmol/L (ref 22–32)
Calcium: 9.4 mg/dL (ref 8.9–10.3)
Chloride: 108 mmol/L (ref 98–111)
Creatinine, Ser: 2.09 mg/dL — ABNORMAL HIGH (ref 0.61–1.24)
GFR calc Af Amer: 30 mL/min — ABNORMAL LOW (ref >60)
GFR calc non Af Amer: 26 mL/min — ABNORMAL LOW (ref >60)
Glucose, Bld: 140 mg/dL — ABNORMAL HIGH (ref 70–99)
Potassium: 3.1 mmol/L — ABNORMAL LOW (ref 3.5–5.1)
Sodium: 146 mmol/L — ABNORMAL HIGH (ref 135–145)

## 2019-07-04 MED ORDER — POTASSIUM CHLORIDE CRYS ER 20 MEQ PO TBCR
40.0000 meq | EXTENDED_RELEASE_TABLET | Freq: Once | ORAL | Status: AC
  Administered 2019-07-04: 21:00:00 40 meq via ORAL
  Filled 2019-07-04: qty 2

## 2019-07-04 MED ORDER — DEXTROSE 5 % IV SOLN
INTRAVENOUS | Status: DC
  Administered 2019-07-04: 17:00:00 50 mL/h via INTRAVENOUS

## 2019-07-04 MED ORDER — CARVEDILOL 6.25 MG PO TABS
6.2500 mg | ORAL_TABLET | Freq: Two times a day (BID) | ORAL | Status: DC
  Administered 2019-07-04: 17:00:00 6.25 mg via ORAL
  Filled 2019-07-04: qty 1

## 2019-07-04 MED ORDER — FUROSEMIDE 40 MG PO TABS: 40.0000 mg | ORAL_TABLET | Freq: Every day | ORAL | Status: DC

## 2019-07-04 NOTE — Progress Notes (Signed)
PROGRESS NOTE    Jack Zuniga  WUJ:[id number] DOB: [birth year] DOA: 06/18/2019 PCP: Devra Dopp, MD   Brief Narrative: Jack Zuniga is a 86 y.o. male with a history of constipation, combined systolic/diastolic heart failure, hypertension, prior history of CVA. Patient presented secondary to lethargy and found to have a sigmoid volvulus. GI consulted for decompression.   Assessment & Plan:   Principal Problem:   Sigmoid volvulus (HCC) Active Problems:   Dehydration   Essential hypertension   DNR (do not resuscitate)/DNI(Do Not intubate)   AKI (acute kidney injury) (HCC)   Sepsis with acute organ dysfunction without septic shock (HCC)   Sigmoid volvulus Patient is not symptomatic at this time. GI consulted and performed flexible sigmoidoscopy for decompression -GI recommendations: awaiting attending physician assessment  Sepsis Ruled out. Likely not sepsis but rather dehydration and inflammation from volvulus. Do not suspect any infectious source.  Dehydration IV fluids initiated. Hold secondary to below  Chronic combined systolic and diastolic heart failure Last EF of 25-30% from Transthoracic Echocardiogram in 2020. Patient is on Coreg, Lasix and lisinopril as an outpatient. Hypovolemic on admission. Rales on exam today. On room air. Weight is stable -BMP, chest x-ray  AKI on CKD stage IIIa Baseline creatinine of 1.3. Creatinine on admission of 2.25. Trending down slightly with IV fluids. In setting of lisinopril and Lasix use. -Holding IV fluids secondary to rales  Essential hypertension Patient is on lisinopril and Coreg as an outpatient   DVT prophylaxis: SCDs Code Status:   Code Status: DNR Family Communication: None at bedside Disposition Plan: Discharge pending GI recommendations   Consultants:   [facility]  Procedures:   FLEXIBLE SIGMOIDOSCOPY (09-Jul-2019) Impression:               - Distal sigmoid volvulus. Successful decompression        achieved.                           - Dilated sigmoid colon.                           - No evidence of significant mucosal ischemia. No                            mass lesions were seen though stool present                            throughout the examined colon.                           - No specimens collected.                           - Images could not be captured due to technical                            difficulty with the processor. Image quality during                            the entire procedure was normal. Moderate Sedation:      Moderate (conscious) sedation was administered by the endoscopy nurse       and supervised  by the endoscopist. The following parameters were       monitored: oxygen saturation, heart rate, blood pressure, and response       to care. Total physician intraservice time was 16 minutes. Recommendation:           - Return patient to hospital ward for ongoing care.                           - Liquid diet, advance diet as tolerated.                           - Daily MiraLax to avoid constipation.                           - Ultimately treatment for volvulus is surgical,                            but with advanced age patient and family do not                            wish to pursue surgical intervention.  Antimicrobials:  Zosyn    Subjective: No issues. Not hungry; he describes not having an appetite. No abdominal pain, nausea or vomiting.  Objective: Vitals:   07/10/2019 1555 07/04/2019 1808 07/10/2019 2134 07/07/2019 0535  BP: (!) 165/69 (!) 154/98 (!) 159/82 (!) 160/107  Pulse:  94 89 92  Resp: 17 18 20 20   Temp:  97.8 F (36.6 C) 98 F (36.7 C) 98.3 F (36.8 C)  TempSrc:  Oral Oral Oral  SpO2:  95% 92% 90%  Weight:    70.5 kg  Height:        Intake/Output Summary (Last 24 hours) at 2019/07/07 1302 Last data filed at July 07, 2019 0542 Gross per 24 hour  Intake 425.36 ml  Output 651 ml  Net -225.64 ml   Filed Weights   07/04/2019  0417 06/28/2019 1407 07-07-19 0535  Weight: 70.9 kg 70.9 kg 70.5 kg    Examination:  General exam: Appears calm and comfortable Respiratory system: Diffuse rales. Respiratory effort normal. Cardiovascular system: S1 & S2 heard, RRR. No murmurs, rubs, gallops or clicks. Gastrointestinal system: Abdomen is distended, soft and nontender. Central nervous system: Alert and oriented to person. Extremities: No edema. No calf tenderness Skin: No cyanosis. No rashes Psychiatry: Judgement and insight appear impaired.    Data Reviewed: I have personally reviewed following labs and imaging studies  CBC: Recent Labs  Lab 07/08/2019 1923 06/19/2019 0653  WBC 14.9* 13.2*  NEUTROABS 12.6* 11.3*  HGB 10.8* 11.7*  HCT 35.1* 37.5*  MCV 76.1* 77.5*  PLT 199 221   Basic Metabolic Panel: Recent Labs  Lab 06/28/2019 1923 06/17/2019 0653  NA 146* 144  K 3.5 4.1  CL 108 107  CO2 25 25  GLUCOSE 131* 110*  BUN 51* 53*  CREATININE 2.25* 2.04*  CALCIUM 9.2 9.3   GFR: Estimated Creatinine Clearance: 19.6 mL/min (A) (by C-G formula based on SCr of 2.04 mg/dL (H)). Liver Function Tests: Recent Labs  Lab 06/17/2019 1923 06/18/2019 0653  AST 182* 134*  ALT 189* 164*  ALKPHOS 58 62  BILITOT 0.6 1.0  PROT 7.7 8.5*  ALBUMIN 3.3* 3.5   No results for input(s): LIPASE, AMYLASE in the last 168 hours.  No results for input(s): AMMONIA in the last 168 hours. Coagulation Profile: No results for input(s): INR, PROTIME in the last 168 hours. Cardiac Enzymes: No results for input(s): CKTOTAL, CKMB, CKMBINDEX, TROPONINI in the last 168 hours. BNP (last 3 results) No results for input(s): PROBNP in the last 8760 hours. HbA1C: No results for input(s): HGBA1C in the last 72 hours. CBG: No results for input(s): GLUCAP in the last 168 hours. Lipid Profile: No results for input(s): CHOL, HDL, LDLCALC, TRIG, CHOLHDL, LDLDIRECT in the last 72 hours. Thyroid Function Tests: No results for input(s): TSH, T4TOTAL,  FREET4, T3FREE, THYROIDAB in the last 72 hours. Anemia Panel: No results for input(s): VITAMINB12, FOLATE, FERRITIN, TIBC, IRON, RETICCTPCT in the last 72 hours. Sepsis Labs: Recent Labs  Lab 06/24/2019 1923 07/15/2019 0653 07/10/19 0429  PROCALCITON  --  5.24 3.33  LATICACIDVEN 2.7* 2.0*  --     Recent Results (from the past 240 hour(s))  Urine Culture     Status: Abnormal (Preliminary result)   Collection Time: 07/08/2019  7:21 PM   Specimen: Urine, Clean Catch  Result Value Ref Range Status   Specimen Description   Final    URINE, CLEAN CATCH Performed at [facility], [address], [place], [place] [address]    Special Requests   Final    NONE Performed at [facility], [address] [place], [place] [address]    Culture (A)  Final    >=100,000 COLONIES/mL ENTEROCOCCUS FAECALIS SUSCEPTIBILITIES TO FOLLOW Performed at [facility], [address], [place], [place] [address]    Report Status PENDING  Incomplete  Respiratory Panel by RT PCR (Flu A&B, Covid) - Nasopharyngeal Swab     Status: None   Collection Time: 06/27/2019 11:40 PM   Specimen: Nasopharyngeal Swab  Result Value Ref Range Status   SARS Coronavirus 2 by RT PCR NEGATIVE NEGATIVE Final    Comment: (NOTE) SARS-CoV-2 target nucleic acids are NOT DETECTED. The SARS-CoV-2 RNA is generally detectable in upper respiratoy specimens during the acute phase of infection. The lowest concentration of SARS-CoV-2 viral copies this assay can detect is 131 copies/mL. A negative result does not preclude SARS-Cov-2 infection and should not be used as the sole basis for treatment or other patient management decisions. A negative result may occur with  improper specimen collection/handling, submission of specimen other than nasopharyngeal swab, presence of viral mutation(s) within the areas targeted by this assay, and inadequate number of viral copies (<131 copies/mL). A negative  result must be combined with clinical observations, patient history, and epidemiological information. The expected result is Negative. Fact Sheet for Patients:  https://www.moore.com/ Fact Sheet for Healthcare Providers:  https://www.young.biz/ This test is not yet ap proved or cleared by the [place] FDA and  has been authorized for detection and/or diagnosis of SARS-CoV-2 by FDA under an Emergency Use Authorization (EUA). This EUA will remain  in effect (meaning this test can be used) for the duration of the COVID-19 declaration under Section 564(b)(1) of the Act, 21 U.S.C. section 360bbb-3(b)(1), unless the authorization is terminated or revoked sooner.    Influenza A by PCR NEGATIVE NEGATIVE Final   Influenza B by PCR NEGATIVE NEGATIVE Final    Comment: (NOTE) The Xpert Xpress SARS-CoV-2/FLU/RSV assay is intended as an aid in  the diagnosis of influenza from Nasopharyngeal swab specimens and  should not be used as a sole basis for treatment. Nasal washings and  aspirates are unacceptable for Xpert  Xpress SARS-CoV-2/FLU/RSV  testing. Fact Sheet for Patients: https://www.moore.com/ Fact Sheet for Healthcare Providers: https://www.young.biz/ This test is not yet approved or cleared by the [place] FDA and  has been authorized for detection and/or diagnosis of SARS-CoV-2 by  FDA under an Emergency Use Authorization (EUA). This EUA will remain  in effect (meaning this test can be used) for the duration of the  Covid-19 declaration under Section 564(b)(1) of the Act, 21  U.S.C. section 360bbb-3(b)(1), unless the authorization is  terminated or revoked. Performed at [facility], [address] [place], [place] [address]   Blood culture (routine x 2)     Status: None (Preliminary result)   Collection Time: 07/01/2019  6:53 AM   Specimen: BLOOD  Result Value Ref Range Status    Specimen Description   Final    BLOOD RIGHT ARM Performed at [facility], [address] [place], [place] [address]    Special Requests   Final    BOTTLES DRAWN AEROBIC ONLY Blood Culture adequate volume Performed at [facility], [address] [place], [place] [address]    Culture   Final    NO GROWTH 1 DAY Performed at [facility], [address] [place], [place] [address]    Report Status PENDING  Incomplete  Blood culture (routine x 2)     Status: None (Preliminary result)   Collection Time: 07/07/2019  6:59 AM   Specimen: BLOOD  Result Value Ref Range Status   Specimen Description   Final    BLOOD LEFT ARM Performed at [facility], [address], [place], [place] [address]    Special Requests   Final    BOTTLES DRAWN AEROBIC ONLY Blood Culture adequate volume Performed at [facility], [address], [place], [place] [address]    Culture   Final    NO GROWTH 1 DAY Performed at [facility], [address], [place], [place] [address]    Report Status PENDING  Incomplete         Radiology Studies: CT ABDOMEN PELVIS WO CONTRAST  Addendum Date: 07/10/2019   ADDENDUM REPORT: 06/19/2019 23:51 ADDENDUM: The results worrisome for malignancy or metastasis will be called to the post-operative clinician or representative tomorrow morning by the Radiologist Assistant, and communication documented in the PACS or [employer]. Electronically Signed   By: Laurence Ferrari   On: 06/16/2019 23:51   Result Date: 07/11/2019 CLINICAL DATA:  Abdominal distension.  Altered mental status. EXAM: CT ABDOMEN AND PELVIS WITHOUT CONTRAST TECHNIQUE: Multidetector CT imaging of the abdomen and pelvis was performed following the standard protocol without IV contrast. COMPARISON:  January 17, 2015 FINDINGS: Lower chest: Moderate cardiomegaly with physiologic amount of pericardial fluid. Moderate to severe  LAD, right main and left circumflex coronary calcification. Aortic valvular calcification. Patulous distal esophagus. Mild bronchiectasis with probable aspiration in the right middle lobe and lingula. Bilateral bronchitis. Suspicious 18 x 13 mm solid nodule noncalcified in the right middle lobe, series 3, image 7. Additional smaller sub solid and mixed nodularities in the same region could be present given the patchy consolidation and alveolar disease in both lungs. Mild calcified atherosclerosis and 3.5 cm diameter of the descending thoracic aorta. Hepatobiliary: Arm and telemetry leads placement markedly degrade evaluation of the vessel parenchyma. Normal hepatic size. Pancreas: Mild atrophy.  No acute pancreatitis. Spleen: Small spleen with normal splenic contours measuring 7.4 x 2.0 cm. Adrenals/Urinary Tract:  Abnormal urinary bladder trabeculation and mild diffuse wall thickening with small bladder diverticula better probably postobstructive from prostatic hyperplasia. Bilateral medical renal disease. No hydronephrosis for nephroureteral calculus. No apparent abnormality of the adrenal glands. Stomach/Bowel: Decompressed gastric and small bowel lumens without obstruction or apparent mucosal thickening. Abnormal 12 cm gaseous distension of a redundant sigmoid colon in the right abdomen with 360 degree counterclockwise volvulus of the efferent loop. No evidence of bowel wall thickening or pneumatosis coli. A small amount of stool in the rectum. Mild gaseous distension of the more proximal colon, which is displaced towards the left abdomen. Normal appendix, coronal image 43. Vascular/Lymphatic: Aorto bi-iliac calcified atherosclerosis without an abdominal aortic aneurysm. Nonspecific bilateral inguinal lymph nodes. An enlarged right inguinal 11 Hounsfield unit, 17 x 20 mm fluid collection with lobulated contours. Reproductive: Massive prostatomegaly measuring 7.6 cm diameter with mass effect on the posterior  urinary bladder. Other: Trace 6 Hounsfield unit free fluid in the dependent right hemipelvis. The Musculoskeletal: Diffuse permeative bone pattern and moderate degenerative changes. A focal fat-density lytic lesion in the right iliac crest, likely benign and possibly an hemangioma. Critical Value/emergent results were called by telephone at the time of interpretation on 07/11/2019 at 11:06 pm to provider DR. Blanchie Dessert , who verbally acknowledged these results. IMPRESSION: Acute abdomen with sigmoid volvulus. Emergent surgical consultation is indicated. No apparent bowel ischemia or pneumatosis. A suspicious solid right middle lobe pulmonary nodule, highly worrisome for a primary bronchopulmonary carcinoma or metastatic lesion to the lung. Further evaluation with 18- fluorine FDG PET CT or histopathological sampling could be considered. Moderate cardiomegaly and moderate to severe LAD coronary calcification with bilateral bronchitis and bronchiectasis, and patchy airspace and alveolar disease in both lungs concerning for possible aspiration-pneumonitis. An additional underlying pulmonary nodularity could also be present. Massive prostatomegaly and urinary bladder findings concerning for urinary outlet obstruction. Abnormal permeative bone marrow pattern, possibly secondary to aggressive osteopenia or a benign or malignant marrow infiltrating disease. Aortic calcified atherosclerosis and 3.5 cm ectatic caliber of the descending thoracic aorta. A 17 x 20 mm 11 Hounsfield unit lobular density in the right inguinal canal, probably a hernia repair plug. Correlation with surgical history recommended. Electronically Signed: By: Revonda Humphrey On: 07/06/2019 23:29   DG Abdomen 1 View  Result Date: 06/30/2019 CLINICAL DATA:  86 year old male with prominent air noted in the chest x-ray. EXAM: ABDOMEN - 1 VIEW COMPARISON:  Chest radiograph dated 07/01/2019 abdominal radiograph dated 01/17/2015 and CT abdomen pelvis  dated 01/17/2015. FINDINGS: Evaluation is limited as the upper abdomen is not included in the images. There is a large gas-filled structure in the right hemiabdomen which is suboptimally evaluated. The pattern of air likely represents a distended loop of colon in this patient with history of prior colonic ileus. Pneumoperitoneum is much less likely. Repeat radiograph with inclusion of the upper abdomen and left lateral decubitus or further evaluation with CT is recommended. There is degenerative changes of the spine. No acute osseous pathology. IMPRESSION: Air-filled structure in the right hemiabdomen likely represents a distended loop of colon. Clinical correlation and further evaluation with additional radiographs or CT as above. Electronically Signed   By: Anner Crete M.D.   On: 06/23/2019 20:10   DG Chest Port 1 View  Result Date: 06/26/2019 CLINICAL DATA:  Weakness EXAM: PORTABLE CHEST 1 VIEW COMPARISON:  12/29/2017, 01/19/2015 FINDINGS: Mildly low lung volumes. Coarse chronic interstitial opacity. Patchy opacity right infrahilar lung probable atelectasis. Stable cardiomediastinal silhouette. Lucency beneath the right  diaphragm presumably due to air distended colon. IMPRESSION: 1. Mild right infrahilar atelectasis or minimal infiltrate. 2. Lucency beneath the right diaphragm suspected to be secondary to prominent air distension of the colon. Electronically Signed   By: Jasmine Pang M.D.   On: 06/30/2019 19:26   DG Abd Portable 1V  Result Date: 07/13/19 CLINICAL DATA:  Volvulus and distention. EXAM: PORTABLE ABDOMEN - 1 VIEW COMPARISON:  07/07/2019 FINDINGS: Diffuse gaseous dilatation of colon evident, similar to prior. Some associated small bowel distension noted. No evidence for pneumatosis. Convex leftward lumbar scoliosis noted. IMPRESSION: Stable marked gaseous dilatation of colon and some associated small bowel distension. Electronically Signed   By: Kennith Center M.D.   On: Jul 13, 2019  10:46   DG Abd Portable 1V  Result Date: 06/25/2019 CLINICAL DATA:  Sigmoid volvulus.  Follow-up. EXAM: PORTABLE ABDOMEN - 1 VIEW COMPARISON:  06/22/2019 FINDINGS: Marked gaseous distention of the colon again noted, unchanged. Rectal tube is seen in place. No visible free air organomegaly. IMPRESSION: Marked colonic gaseous distention with rectal tube in place. No significant change in overall gaseous distention. Electronically Signed   By: Charlett Nose M.D.   On: 06/27/2019 08:43        Scheduled Meds: . polyethylene glycol  17 g Oral Daily   Continuous Infusions: . piperacillin-tazobactam (ZOSYN)  IV 2.25 g (July 13, 2019 0841)     LOS: 1 day     Jacquelin Hawking, MD [facility] July 13, 2019, 1:02 PM  If 7PM-7AM, please contact night-coverage [email or web address]

## 2019-07-04 NOTE — Progress Notes (Signed)
Progress Note   Subjective  Chief Complaint: Sigmoid volvulus  This morning, the patient tells me that he had a very small amount of liquid stool this morning, but when double checking with nursing they say he has not had anything overnight or this morning per their records.  Nursing tells me that he did not touch his clear liquids this morning.  They have not yet given him his MiraLAX.  Patient denies abdominal pain.   Objective   Vital signs in last 24 hours: Temp:  [96.3 F (35.7 C)-98.3 F (36.8 C)] 98.3 F (36.8 C) (03/19 0535) Pulse Rate:  [88-104] 92 (03/19 0535) Resp:  [17-28] 20 (03/19 0535) BP: (147-179)/(68-135) 160/107 (03/19 0535) SpO2:  [90 %-100 %] 90 % (03/19 0535) Weight:  [70.5 kg-70.9 kg] 70.5 kg (03/19 0535)   General:    AA male in NAD Heart:  Regular rate and rhythm; no murmurs Lungs: Respirations even and unlabored, lungs CTA bilaterally Abdomen:  Soft, nontender and Moderate distension, High pitched bowel sounds, Hypertympanic Extremities:  Without edema. Neurologic:  Alert and oriented,  grossly normal neurologically. Psych:  Cooperative. Normal mood and affect.  Intake/Output from previous day: 03/18 0701 - 03/19 0700 In: 425.4 [I.V.:375.4; IV Piggyback:50] Out: 417 [Urine:651]  Lab Results: Recent Labs    06/17/2019 1923 Jul 23, 2019 0653  WBC 14.9* 13.2*  HGB 10.8* 11.7*  HCT 35.1* 37.5*  PLT 199 221   BMET Recent Labs    06/27/2019 1923 Jul 23, 2019 0653  NA 146* 144  K 3.5 4.1  CL 108 107  CO2 25 25  GLUCOSE 131* 110*  BUN 51* 53*  CREATININE 2.25* 2.04*  CALCIUM 9.2 9.3   LFT Recent Labs    07/23/19 0653  PROT 8.5*  ALBUMIN 3.5  AST 134*  ALT 164*  ALKPHOS 62  BILITOT 1.0   Studies/Results: CT ABDOMEN PELVIS WO CONTRAST  Addendum Date: 07/04/2019   ADDENDUM REPORT: 06/20/2019 23:51 ADDENDUM: The results worrisome for malignancy or metastasis will be called to the post-operative clinician or representative tomorrow morning  by the Radiologist Assistant, and communication documented in the PACS or [employer]. Electronically Signed   By: Revonda Humphrey   On: 06/21/2019 23:51   Result Date: 06/30/2019 CLINICAL DATA:  Abdominal distension.  Altered mental status. EXAM: CT ABDOMEN AND PELVIS WITHOUT CONTRAST TECHNIQUE: Multidetector CT imaging of the abdomen and pelvis was performed following the standard protocol without IV contrast. COMPARISON:  January 17, 2015 FINDINGS: Lower chest: Moderate cardiomegaly with physiologic amount of pericardial fluid. Moderate to severe LAD, right main and left circumflex coronary calcification. Aortic valvular calcification. Patulous distal esophagus. Mild bronchiectasis with probable aspiration in the right middle lobe and lingula. Bilateral bronchitis. Suspicious 18 x 13 mm solid nodule noncalcified in the right middle lobe, series 3, image 7. Additional smaller sub solid and mixed nodularities in the same region could be present given the patchy consolidation and alveolar disease in both lungs. Mild calcified atherosclerosis and 3.5 cm diameter of the descending thoracic aorta. Hepatobiliary: Arm and telemetry leads placement markedly degrade evaluation of the vessel parenchyma. Normal hepatic size. Pancreas: Mild atrophy.  No acute pancreatitis. Spleen: Small spleen with normal splenic contours measuring 7.4 x 2.0 cm. Adrenals/Urinary Tract: Abnormal urinary bladder trabeculation and mild diffuse wall thickening with small bladder diverticula better probably postobstructive from prostatic hyperplasia. Bilateral medical renal disease. No hydronephrosis for nephroureteral calculus. No apparent abnormality of the adrenal glands. Stomach/Bowel: Decompressed gastric and small bowel lumens without  obstruction or apparent mucosal thickening. Abnormal 12 cm gaseous distension of a redundant sigmoid colon in the right abdomen with 360 degree counterclockwise volvulus of the efferent loop. No evidence of  bowel wall thickening or pneumatosis coli. A small amount of stool in the rectum. Mild gaseous distension of the more proximal colon, which is displaced towards the left abdomen. Normal appendix, coronal image 43. Vascular/Lymphatic: Aorto bi-iliac calcified atherosclerosis without an abdominal aortic aneurysm. Nonspecific bilateral inguinal lymph nodes. An enlarged right inguinal 11 Hounsfield unit, 17 x 20 mm fluid collection with lobulated contours. Reproductive: Massive prostatomegaly measuring 7.6 cm diameter with mass effect on the posterior urinary bladder. Other: Trace 6 Hounsfield unit free fluid in the dependent right hemipelvis. The Musculoskeletal: Diffuse permeative bone pattern and moderate degenerative changes. A focal fat-density lytic lesion in the right iliac crest, likely benign and possibly an hemangioma. Critical Value/emergent results were called by telephone at the time of interpretation on 07/10/2019 at 11:06 pm to provider DR. Gwyneth Sprout , who verbally acknowledged these results. IMPRESSION: Acute abdomen with sigmoid volvulus. Emergent surgical consultation is indicated. No apparent bowel ischemia or pneumatosis. A suspicious solid right middle lobe pulmonary nodule, highly worrisome for a primary bronchopulmonary carcinoma or metastatic lesion to the lung. Further evaluation with 18- fluorine FDG PET CT or histopathological sampling could be considered. Moderate cardiomegaly and moderate to severe LAD coronary calcification with bilateral bronchitis and bronchiectasis, and patchy airspace and alveolar disease in both lungs concerning for possible aspiration-pneumonitis. An additional underlying pulmonary nodularity could also be present. Massive prostatomegaly and urinary bladder findings concerning for urinary outlet obstruction. Abnormal permeative bone marrow pattern, possibly secondary to aggressive osteopenia or a benign or malignant marrow infiltrating disease. Aortic calcified  atherosclerosis and 3.5 cm ectatic caliber of the descending thoracic aorta. A 17 x 20 mm 11 Hounsfield unit lobular density in the right inguinal canal, probably a hernia repair plug. Correlation with surgical history recommended. Electronically Signed: By: Laurence Ferrari On: 06/23/2019 23:29   DG Abdomen 1 View  Result Date: 06/27/2019 CLINICAL DATA:  86 year old male with prominent air noted in the chest x-ray. EXAM: ABDOMEN - 1 VIEW COMPARISON:  Chest radiograph dated 06/29/2019 abdominal radiograph dated 01/17/2015 and CT abdomen pelvis dated 01/17/2015. FINDINGS: Evaluation is limited as the upper abdomen is not included in the images. There is a large gas-filled structure in the right hemiabdomen which is suboptimally evaluated. The pattern of air likely represents a distended loop of colon in this patient with history of prior colonic ileus. Pneumoperitoneum is much less likely. Repeat radiograph with inclusion of the upper abdomen and left lateral decubitus or further evaluation with CT is recommended. There is degenerative changes of the spine. No acute osseous pathology. IMPRESSION: Air-filled structure in the right hemiabdomen likely represents a distended loop of colon. Clinical correlation and further evaluation with additional radiographs or CT as above. Electronically Signed   By: Elgie Collard M.D.   On: 06/24/2019 20:10   DG Chest Port 1 View  Result Date: 06/30/2019 CLINICAL DATA:  Weakness EXAM: PORTABLE CHEST 1 VIEW COMPARISON:  12/29/2017, 01/19/2015 FINDINGS: Mildly low lung volumes. Coarse chronic interstitial opacity. Patchy opacity right infrahilar lung probable atelectasis. Stable cardiomediastinal silhouette. Lucency beneath the right diaphragm presumably due to air distended colon. IMPRESSION: 1. Mild right infrahilar atelectasis or minimal infiltrate. 2. Lucency beneath the right diaphragm suspected to be secondary to prominent air distension of the colon. Electronically  Signed   By: Adrian Prows.D.  On: 06/30/2019 19:26   DG Abd Portable 1V  Result Date: 07/16/2019 CLINICAL DATA:  Sigmoid volvulus.  Follow-up. EXAM: PORTABLE ABDOMEN - 1 VIEW COMPARISON:  07/09/2019 FINDINGS: Marked gaseous distention of the colon again noted, unchanged. Rectal tube is seen in place. No visible free air organomegaly. IMPRESSION: Marked colonic gaseous distention with rectal tube in place. No significant change in overall gaseous distention. Electronically Signed   By: Charlett Nose M.D.   On: 06/22/2019 08:43       Assessment / Plan:   Assessment: 1.  Sigmoid volvulus: Successful decompression 07/08/2019, patient now with increased distention hypertympanic abdomen and not eating, will recheck x-ray 2.  Chronic combined systolic and diastolic heart failure: Last EF of 25-30% from transthoracic echocardiogram in 2020 3.  AKI on CKD stage II  Plan: 1.  Ordered repeat abdominal x-ray this morning 2.  Please await any further recommendations after above from Dr. Rhea Belton  Thank you for your kind consultation.    LOS: 1 day   Unk Lightning  07/04/2019, 9:42 AM

## 2019-07-08 LAB — CULTURE, BLOOD (ROUTINE X 2)
Culture: NO GROWTH
Culture: NO GROWTH
Special Requests: ADEQUATE
Special Requests: ADEQUATE

## 2019-07-11 LAB — SUSCEPTIBILITY, AER + ANAEROB

## 2019-07-11 LAB — SUSCEPTIBILITY RESULT

## 2019-07-15 LAB — URINE CULTURE: Culture: >=100000 — AB

## 2019-07-17 NOTE — Progress Notes (Addendum)
Pt call light went off this RN went to the room with Nurse tech, PT was opened eye and looking at nurse next moment pt closed his eyes. Tried to rouse him but became unresponsive. Gernelle RN and Turner Daniels RN are the two nurses to pronounce death. On call E. [place], NP notified. NP called this nurse and informed nurse she is remote and Attending - Tish Frederickson. MD-  will complete certificate in the morning since family upon notification were surprised and had no prior arrangement in the event this occurred. [facility] notified, Family on their way to see PT. CN made aware

## 2019-07-17 NOTE — Progress Notes (Signed)
Pt family were here to see him and patient placement called .

## 2019-07-17 NOTE — Death Summary Note (Signed)
DEATH SUMMARY   Patient Details  Name: Jack Zuniga MRN: [id number] DOB: 02-15-1920  Admission/Discharge Information   Admit Date:  01-Aug-2019  Date of Death: Date of Death: 08/04/2019  Time of Death: Time of Death: Sep 06, 2035  Length of Stay: 2  Referring Physician: Devra Dopp, MD   Reason(s) for Hospitalization  Abdominal pain  Diagnoses  Preliminary cause of death:  Secondary Diagnoses (including complications and co-morbidities):  Principal Problem:   Sigmoid volvulus (HCC) Active Problems:   Dehydration   Essential hypertension   DNR (do not resuscitate)/DNI(Do Not intubate)   AKI (acute kidney injury) (HCC)   Sepsis with acute organ dysfunction without septic shock [facility])   Brief Hospital Course (including significant findings, care, treatment, and services provided and events leading to death)  Jack Zuniga is a 86 y.o. year old male with a history of constipation, combined systolic/diastolic heart failure, hypertension, prior history of CVA. Patient presented secondary to lethargy and found to have a sigmoid volvulus. GI consulted for flixble sigmoidoscopy for which he underwent on 3/19. He remained distended with persistent volvulus on repeat imaging. Plan was to attempt a repeat flexible sigmoidoscopy however, overnight per nurse report, patient became unresponsive. Code was not called at that time secondary to DNR status.  Sigmoid volvulus  GI consulted and performed flexible sigmoidoscopy for decompression. Patient with worsening distention the following day but continued to remain asymptomatic.  Sepsis Ruled out. Likely not sepsis but rather dehydration and inflammation from volvulus. Do not suspect any infectious source however patient was treated empirically with Zosyn IV.  Dehydration IV fluids initiated. Hold secondary to below  Chronic combined systolic and diastolic heart failure Last EF of 25-30% from Transthoracic Echocardiogram in Sep 06, 2018. Patient is on  Coreg, Lasix and lisinopril as an outpatient. Hypovolemic on admission. On room air. Weight is stable. There were rales on exam with chest x-ray suggesting infiltrate possibly compatible with pneumonia as mentioned below. Does not appear to have had a heart failure exacerbation.  AKI on CKD stage IIIa Baseline creatinine of 1.3. Creatinine on admission of 2.25. Trending down slightly with IV fluids. In setting of lisinopril and Lasix use.  Essential hypertension Patient is on lisinopril and Coreg as an outpatient  Abnormal chest x-ray Right infiltrate which is possibly infectious. No symptoms to suggest pneumonia but patient was empirically covered with Zosyn IV for above issue.    Pertinent Labs and Studies  Significant Diagnostic Studies CT ABDOMEN PELVIS WO CONTRAST  Addendum Date: Aug 01, 2019   ADDENDUM REPORT: Aug 01, 2019 23:51 ADDENDUM: The results worrisome for malignancy or metastasis will be called to the post-operative clinician or representative tomorrow morning by the Radiologist Assistant, and communication documented in the PACS or [employer]. Electronically Signed   By: Laurence Ferrari   On: 2019/08/01 23:51   Result Date: 08-01-19 CLINICAL DATA:  Abdominal distension.  Altered mental status. EXAM: CT ABDOMEN AND PELVIS WITHOUT CONTRAST TECHNIQUE: Multidetector CT imaging of the abdomen and pelvis was performed following the standard protocol without IV contrast. COMPARISON:  January 17, 2015 FINDINGS: Lower chest: Moderate cardiomegaly with physiologic amount of pericardial fluid. Moderate to severe LAD, right main and left circumflex coronary calcification. Aortic valvular calcification. Patulous distal esophagus. Mild bronchiectasis with probable aspiration in the right middle lobe and lingula. Bilateral bronchitis. Suspicious 18 x 13 mm solid nodule noncalcified in the right middle lobe, series 3, image 7. Additional smaller sub solid and mixed nodularities in the same  region could be present given the patchy consolidation  and alveolar disease in both lungs. Mild calcified atherosclerosis and 3.5 cm diameter of the descending thoracic aorta. Hepatobiliary: Arm and telemetry leads placement markedly degrade evaluation of the vessel parenchyma. Normal hepatic size. Pancreas: Mild atrophy.  No acute pancreatitis. Spleen: Small spleen with normal splenic contours measuring 7.4 x 2.0 cm. Adrenals/Urinary Tract: Abnormal urinary bladder trabeculation and mild diffuse wall thickening with small bladder diverticula better probably postobstructive from prostatic hyperplasia. Bilateral medical renal disease. No hydronephrosis for nephroureteral calculus. No apparent abnormality of the adrenal glands. Stomach/Bowel: Decompressed gastric and small bowel lumens without obstruction or apparent mucosal thickening. Abnormal 12 cm gaseous distension of a redundant sigmoid colon in the right abdomen with 360 degree counterclockwise volvulus of the efferent loop. No evidence of bowel wall thickening or pneumatosis coli. A small amount of stool in the rectum. Mild gaseous distension of the more proximal colon, which is displaced towards the left abdomen. Normal appendix, coronal image 43. Vascular/Lymphatic: Aorto bi-iliac calcified atherosclerosis without an abdominal aortic aneurysm. Nonspecific bilateral inguinal lymph nodes. An enlarged right inguinal 11 Hounsfield unit, 17 x 20 mm fluid collection with lobulated contours. Reproductive: Massive prostatomegaly measuring 7.6 cm diameter with mass effect on the posterior urinary bladder. Other: Trace 6 Hounsfield unit free fluid in the dependent right hemipelvis. The Musculoskeletal: Diffuse permeative bone pattern and moderate degenerative changes. A focal fat-density lytic lesion in the right iliac crest, likely benign and possibly an hemangioma. Critical Value/emergent results were called by telephone at the time of interpretation on 07/08/2019 at  11:06 pm to provider DR. Gwyneth Sprout , who verbally acknowledged these results. IMPRESSION: Acute abdomen with sigmoid volvulus. Emergent surgical consultation is indicated. No apparent bowel ischemia or pneumatosis. A suspicious solid right middle lobe pulmonary nodule, highly worrisome for a primary bronchopulmonary carcinoma or metastatic lesion to the lung. Further evaluation with 18- fluorine FDG PET CT or histopathological sampling could be considered. Moderate cardiomegaly and moderate to severe LAD coronary calcification with bilateral bronchitis and bronchiectasis, and patchy airspace and alveolar disease in both lungs concerning for possible aspiration-pneumonitis. An additional underlying pulmonary nodularity could also be present. Massive prostatomegaly and urinary bladder findings concerning for urinary outlet obstruction. Abnormal permeative bone marrow pattern, possibly secondary to aggressive osteopenia or a benign or malignant marrow infiltrating disease. Aortic calcified atherosclerosis and 3.5 cm ectatic caliber of the descending thoracic aorta. A 17 x 20 mm 11 Hounsfield unit lobular density in the right inguinal canal, probably a hernia repair plug. Correlation with surgical history recommended. Electronically Signed: By: Laurence Ferrari On: 07/06/2019 23:29   DG Abdomen 1 View  Result Date: 06/20/2019 CLINICAL DATA:  86 year old male with prominent air noted in the chest x-ray. EXAM: ABDOMEN - 1 VIEW COMPARISON:  Chest radiograph dated 07/04/2019 abdominal radiograph dated 01/17/2015 and CT abdomen pelvis dated 01/17/2015. FINDINGS: Evaluation is limited as the upper abdomen is not included in the images. There is a large gas-filled structure in the right hemiabdomen which is suboptimally evaluated. The pattern of air likely represents a distended loop of colon in this patient with history of prior colonic ileus. Pneumoperitoneum is much less likely. Repeat radiograph with inclusion of  the upper abdomen and left lateral decubitus or further evaluation with CT is recommended. There is degenerative changes of the spine. No acute osseous pathology. IMPRESSION: Air-filled structure in the right hemiabdomen likely represents a distended loop of colon. Clinical correlation and further evaluation with additional radiographs or CT as above. Electronically Signed   By: Burtis Junes  Radparvar M.D.   On: 06/21/2019 20:10   DG CHEST PORT 1 VIEW  Result Date: 07/04/2019 CLINICAL DATA:  Lethargy.  Reported recent sigmoid volvulus EXAM: PORTABLE CHEST 1 VIEW COMPARISON:  July 02, 2019 chest radiograph; abdominal radiograph July 04, 2019 FINDINGS: There is ill-defined airspace opacity in the left base with atelectasis. There is mild atelectasis in the left base. Heart size and pulmonary vascular normal. No adenopathy. No bone lesions. Bowel dilatation in the right upper quadrant is again noted. Question recurrence of sigmoid volvulus. IMPRESSION: Atelectasis in the bases with ill-defined airspace opacity in the right base, consistent with pneumonia or possibly aspiration. Heart size normal. Extensive lucency in the right upper quadrant which appears to reside within bowel. Question recurrence of sigmoid volvulus. Electronically Signed   By: Bretta Bang III M.D.   On: 07/04/2019 14:47   DG Chest Port 1 View  Result Date: 07/09/2019 CLINICAL DATA:  Weakness EXAM: PORTABLE CHEST 1 VIEW COMPARISON:  12/29/2017, 01/19/2015 FINDINGS: Mildly low lung volumes. Coarse chronic interstitial opacity. Patchy opacity right infrahilar lung probable atelectasis. Stable cardiomediastinal silhouette. Lucency beneath the right diaphragm presumably due to air distended colon. IMPRESSION: 1. Mild right infrahilar atelectasis or minimal infiltrate. 2. Lucency beneath the right diaphragm suspected to be secondary to prominent air distension of the colon. Electronically Signed   By: Jasmine Pang M.D.   On: 07/07/2019 19:26    DG Abd Portable 1V  Result Date: 07/04/2019 CLINICAL DATA:  Volvulus and distention. EXAM: PORTABLE ABDOMEN - 1 VIEW COMPARISON:  07/04/2019 FINDINGS: Diffuse gaseous dilatation of colon evident, similar to prior. Some associated small bowel distension noted. No evidence for pneumatosis. Convex leftward lumbar scoliosis noted. IMPRESSION: Stable marked gaseous dilatation of colon and some associated small bowel distension. Electronically Signed   By: Kennith Center M.D.   On: 07/04/2019 10:46   DG Abd Portable 1V  Result Date: 07/08/2019 CLINICAL DATA:  Sigmoid volvulus.  Follow-up. EXAM: PORTABLE ABDOMEN - 1 VIEW COMPARISON:  07/04/2019 FINDINGS: Marked gaseous distention of the colon again noted, unchanged. Rectal tube is seen in place. No visible free air organomegaly. IMPRESSION: Marked colonic gaseous distention with rectal tube in place. No significant change in overall gaseous distention. Electronically Signed   By: Charlett Nose M.D.   On: 06/29/2019 08:43    Microbiology Recent Results (from the past 240 hour(s))  Urine Culture     Status: Abnormal (Preliminary result)   Collection Time: 07/14/2019  7:21 PM   Specimen: Urine, Clean Catch  Result Value Ref Range Status   Specimen Description   Final    URINE, CLEAN CATCH Performed at [facility], [address], [place], [place] [address]    Special Requests   Final    NONE Performed at [facility], [address], [place], [place] [address]    Culture (A)  Final    >=100,000 COLONIES/mL ENTEROCOCCUS FAECALIS REPEATING Performed at [facility], [address], [place], [place] [address]    Report Status PENDING  Incomplete  Respiratory Panel by RT PCR (Flu A&B, Covid) - Nasopharyngeal Swab     Status: None   Collection Time: 07/13/2019 11:40 PM   Specimen: Nasopharyngeal Swab  Result Value Ref Range Status   SARS Coronavirus 2 by RT PCR NEGATIVE NEGATIVE Final    Comment:  (NOTE) SARS-CoV-2 target nucleic acids are NOT DETECTED. The SARS-CoV-2 RNA is generally detectable in upper respiratoy specimens during the acute phase of infection.  The lowest concentration of SARS-CoV-2 viral copies this assay can detect is 131 copies/mL. A negative result does not preclude SARS-Cov-2 infection and should not be used as the sole basis for treatment or other patient management decisions. A negative result may occur with  improper specimen collection/handling, submission of specimen other than nasopharyngeal swab, presence of viral mutation(s) within the areas targeted by this assay, and inadequate number of viral copies (<131 copies/mL). A negative result must be combined with clinical observations, patient history, and epidemiological information. The expected result is Negative. Fact Sheet for Patients:  PinkCheek.be Fact Sheet for Healthcare Providers:  GravelBags.it This test is not yet ap proved or cleared by the [place] FDA and  has been authorized for detection and/or diagnosis of SARS-CoV-2 by FDA under an Emergency Use Authorization (EUA). This EUA will remain  in effect (meaning this test can be used) for the duration of the COVID-19 declaration under Section 564(b)(1) of the Act, 21 U.S.C. section 360bbb-3(b)(1), unless the authorization is terminated or revoked sooner.    Influenza A by PCR NEGATIVE NEGATIVE Final   Influenza B by PCR NEGATIVE NEGATIVE Final    Comment: (NOTE) The Xpert Xpress SARS-CoV-2/FLU/RSV assay is intended as an aid in  the diagnosis of influenza from Nasopharyngeal swab specimens and  should not be used as a sole basis for treatment. Nasal washings and  aspirates are unacceptable for Xpert Xpress SARS-CoV-2/FLU/RSV  testing. Fact Sheet for Patients: PinkCheek.be Fact Sheet for Healthcare  Providers: GravelBags.it This test is not yet approved or cleared by the [place] FDA and  has been authorized for detection and/or diagnosis of SARS-CoV-2 by  FDA under an Emergency Use Authorization (EUA). This EUA will remain  in effect (meaning this test can be used) for the duration of the  Covid-19 declaration under Section 564(b)(1) of the Act, 21  U.S.C. section 360bbb-3(b)(1), unless the authorization is  terminated or revoked. Performed at [facility], [place] [address] [place], [place] [address]   Blood culture (routine x 2)     Status: None (Preliminary result)   Collection Time: 2019-07-21  6:53 AM   Specimen: BLOOD  Result Value Ref Range Status   Specimen Description   Final    BLOOD RIGHT ARM Performed at [facility] [address] [place], [place] [address]    Special Requests   Final    BOTTLES DRAWN AEROBIC ONLY Blood Culture adequate volume Performed at [facility] [address] [place], [place] [address]    Culture   Final    NO GROWTH 2 DAYS Performed at [facility] [address], [place], [place] [address]    Report Status PENDING  Incomplete  Blood culture (routine x 2)     Status: None (Preliminary result)   Collection Time: 07-21-2019  6:59 AM   Specimen: BLOOD  Result Value Ref Range Status   Specimen Description   Final    BLOOD LEFT ARM Performed at [facility] [address], [place], [place] [address]    Special Requests   Final    BOTTLES DRAWN AEROBIC ONLY Blood Culture adequate volume Performed at [facility] [address], [place], [place] [address]    Culture   Final    NO GROWTH 2 DAYS Performed at [facility] [address], [place],  [address]    Report Status PENDING  Incomplete    Lab Basic  Metabolic Panel: Recent Labs  Lab 06/21/2019 1923 06/18/2019 0653 07/04/19 1522   NA 146* 144 146*  K 3.5 4.1 3.1*  CL 108 107 108  CO2 25 25 25   GLUCOSE 131* 110* 140*  BUN 51* 53* 72*  CREATININE 2.25* 2.04* 2.09*  CALCIUM 9.2 9.3 9.4   Liver Function Tests: Recent Labs  Lab 07/01/2019 1923 06/20/2019 0653  AST 182* 134*  ALT 189* 164*  ALKPHOS 58 62  BILITOT 0.6 1.0  PROT 7.7 8.5*  ALBUMIN 3.3* 3.5   No results for input(s): LIPASE, AMYLASE in the last 168 hours. No results for input(s): AMMONIA in the last 168 hours. CBC: Recent Labs  Lab 07/12/2019 1923 07/11/2019 0653  WBC 14.9* 13.2*  NEUTROABS 12.6* 11.3*  HGB 10.8* 11.7*  HCT 35.1* 37.5*  MCV 76.1* 77.5*  PLT 199 221   Cardiac Enzymes: No results for input(s): CKTOTAL, CKMB, CKMBINDEX, TROPONINI in the last 168 hours. Sepsis Labs: Recent Labs  Lab 07/16/2019 1923 06/21/2019 0653 07/04/19 0429  PROCALCITON  --  5.24 3.33  WBC 14.9* 13.2*  --   LATICACIDVEN 2.7* 2.0*  --     Procedures/Operations    FLEXIBLE SIGMOIDOSCOPY (06/24/2019) Impression: - Distal sigmoid volvulus. Successful decompression  achieved. - Dilated sigmoid colon. - No evidence of significant mucosal ischemia. No  mass lesions were seen though stool present  throughout the examined colon. - No specimens collected. - Images could not be captured due to technical  difficulty with the processor. Image quality during  the entire procedure was normal. Moderate Sedation: Moderate (conscious) sedation was administered by the endoscopy nurse  and supervised by the endoscopist. The following parameters were  monitored: oxygen saturation, heart rate, blood pressure, and response  to care. Total physician intraservice time was 16 minutes. Recommendation: -  Return patient to hospital ward for ongoing care. - Liquid diet, advance diet as tolerated. - Daily MiraLax to avoid constipation. - Ultimately treatment for volvulus is surgical,  but with advanced age patient and family do not  wish to pursue surgical intervention.   06/17/2019, MD 2019-07-22, 10:02 AM

## 2019-07-17 DEATH — deceased

## 2019-08-15 ENCOUNTER — Ambulatory Visit: Payer: Medicare PPO | Admitting: Cardiology
